# Patient Record
Sex: Male | Born: 1960 | Race: White | Hispanic: No | Marital: Single | Smoking: Never smoker
Health system: Southern US, Community
[De-identification: ages and names within clinical notes are randomized; demographics above are authoritative.]

## PROBLEM LIST (undated history)

## (undated) DIAGNOSIS — N183 Chronic kidney disease, stage 3 unspecified: Secondary | ICD-10-CM

## (undated) DIAGNOSIS — I10 Essential (primary) hypertension: Secondary | ICD-10-CM

## (undated) DIAGNOSIS — I5022 Chronic systolic (congestive) heart failure: Secondary | ICD-10-CM

## (undated) HISTORY — PX: ABDOMINAL SURGERY: SHX537

---

## 2019-10-16 ENCOUNTER — Emergency Department: Payer: Self-pay

## 2019-10-16 ENCOUNTER — Inpatient Hospital Stay
Admission: EM | Admit: 2019-10-16 | Discharge: 2019-10-18 | DRG: 291 | Discharge status: Against Medical Advice | Payer: Self-pay | Attending: Internal Medicine | Admitting: Internal Medicine

## 2019-10-16 ENCOUNTER — Other Ambulatory Visit: Payer: Self-pay

## 2019-10-16 DIAGNOSIS — R778 Other specified abnormalities of plasma proteins: Secondary | ICD-10-CM

## 2019-10-16 DIAGNOSIS — I509 Heart failure, unspecified: Secondary | ICD-10-CM

## 2019-10-16 DIAGNOSIS — I160 Hypertensive urgency: Secondary | ICD-10-CM

## 2019-10-16 DIAGNOSIS — E669 Obesity, unspecified: Secondary | ICD-10-CM | POA: Diagnosis present

## 2019-10-16 DIAGNOSIS — I110 Hypertensive heart disease with heart failure: Principal | ICD-10-CM | POA: Diagnosis present

## 2019-10-16 DIAGNOSIS — L03116 Cellulitis of left lower limb: Secondary | ICD-10-CM | POA: Diagnosis present

## 2019-10-16 DIAGNOSIS — I959 Hypotension, unspecified: Secondary | ICD-10-CM | POA: Diagnosis present

## 2019-10-16 DIAGNOSIS — Z9114 Patient's other noncompliance with medication regimen: Secondary | ICD-10-CM

## 2019-10-16 DIAGNOSIS — R7989 Other specified abnormal findings of blood chemistry: Secondary | ICD-10-CM

## 2019-10-16 DIAGNOSIS — R0781 Pleurodynia: Secondary | ICD-10-CM | POA: Diagnosis present

## 2019-10-16 DIAGNOSIS — W010XXA Fall on same level from slipping, tripping and stumbling without subsequent striking against object, initial encounter: Secondary | ICD-10-CM | POA: Diagnosis present

## 2019-10-16 DIAGNOSIS — Z5329 Procedure and treatment not carried out because of patient's decision for other reasons: Secondary | ICD-10-CM | POA: Diagnosis not present

## 2019-10-16 DIAGNOSIS — F329 Major depressive disorder, single episode, unspecified: Secondary | ICD-10-CM | POA: Diagnosis present

## 2019-10-16 DIAGNOSIS — I161 Hypertensive emergency: Secondary | ICD-10-CM

## 2019-10-16 DIAGNOSIS — N179 Acute kidney failure, unspecified: Secondary | ICD-10-CM | POA: Diagnosis present

## 2019-10-16 DIAGNOSIS — I248 Other forms of acute ischemic heart disease: Secondary | ICD-10-CM | POA: Diagnosis present

## 2019-10-16 DIAGNOSIS — L0390 Cellulitis, unspecified: Secondary | ICD-10-CM

## 2019-10-16 DIAGNOSIS — I5021 Acute systolic (congestive) heart failure: Secondary | ICD-10-CM | POA: Diagnosis present

## 2019-10-16 DIAGNOSIS — L03119 Cellulitis of unspecified part of limb: Secondary | ICD-10-CM

## 2019-10-16 DIAGNOSIS — L97929 Non-pressure chronic ulcer of unspecified part of left lower leg with unspecified severity: Secondary | ICD-10-CM | POA: Diagnosis present

## 2019-10-16 DIAGNOSIS — E8770 Fluid overload, unspecified: Secondary | ICD-10-CM | POA: Diagnosis present

## 2019-10-16 DIAGNOSIS — Z6829 Body mass index (BMI) 29.0-29.9, adult: Secondary | ICD-10-CM

## 2019-10-16 DIAGNOSIS — Z20828 Contact with and (suspected) exposure to other viral communicable diseases: Secondary | ICD-10-CM | POA: Diagnosis present

## 2019-10-16 DIAGNOSIS — R0902 Hypoxemia: Secondary | ICD-10-CM | POA: Diagnosis present

## 2019-10-16 HISTORY — DX: Essential (primary) hypertension: I10

## 2019-10-16 LAB — CBC
HCT: 48.7 % (ref 39.0–52.0)
Hemoglobin: 15.5 g/dL (ref 13.0–17.0)
MCH: 28.5 pg (ref 26.0–34.0)
MCHC: 31.8 g/dL (ref 30.0–36.0)
MCV: 89.7 fL (ref 80.0–100.0)
Platelets: 201 10*3/uL (ref 150–400)
RBC: 5.43 MIL/uL (ref 4.22–5.81)
RDW: 14.8 % (ref 11.5–15.5)
WBC: 10.6 10*3/uL — ABNORMAL HIGH (ref 4.0–10.5)
nRBC: 0 % (ref 0.0–0.2)

## 2019-10-16 LAB — COMPREHENSIVE METABOLIC PANEL
ALT: 42 U/L (ref 0–44)
AST: 46 U/L — ABNORMAL HIGH (ref 15–41)
Albumin: 3.9 g/dL (ref 3.5–5.0)
Alkaline Phosphatase: 60 U/L (ref 38–126)
Anion gap: 11 (ref 5–15)
BUN: 36 mg/dL — ABNORMAL HIGH (ref 6–20)
CO2: 22 mmol/L (ref 22–32)
Calcium: 9 mg/dL (ref 8.9–10.3)
Chloride: 108 mmol/L (ref 98–111)
Creatinine, Ser: 1.73 mg/dL — ABNORMAL HIGH (ref 0.61–1.24)
GFR calc Af Amer: 49 mL/min — ABNORMAL LOW (ref >60)
GFR calc non Af Amer: 42 mL/min — ABNORMAL LOW (ref >60)
Glucose, Bld: 132 mg/dL — ABNORMAL HIGH (ref 70–99)
Potassium: 4.2 mmol/L (ref 3.5–5.1)
Sodium: 141 mmol/L (ref 135–145)
Total Bilirubin: 1.2 mg/dL (ref 0.3–1.2)
Total Protein: 7 g/dL (ref 6.5–8.1)

## 2019-10-16 LAB — BRAIN NATRIURETIC PEPTIDE: B Natriuretic Peptide: 2005 pg/mL — ABNORMAL HIGH (ref 0.0–100.0)

## 2019-10-16 LAB — TROPONIN I (HIGH SENSITIVITY)
Troponin I (High Sensitivity): 143 ng/L (ref <18)
Troponin I (High Sensitivity): 143 ng/L (ref <18)

## 2019-10-16 MED ORDER — FUROSEMIDE 10 MG/ML IJ SOLN
40.0000 mg | Freq: Once | INTRAMUSCULAR | Status: AC
  Administered 2019-10-16: 40 mg via INTRAVENOUS
  Filled 2019-10-16: qty 4

## 2019-10-16 MED ORDER — LABETALOL HCL 5 MG/ML IV SOLN
10.0000 mg | Freq: Once | INTRAVENOUS | Status: AC
  Administered 2019-10-16: 10 mg via INTRAVENOUS
  Filled 2019-10-16: qty 4

## 2019-10-16 MED ORDER — KETOROLAC TROMETHAMINE 30 MG/ML IJ SOLN
30.0000 mg | Freq: Once | INTRAMUSCULAR | Status: AC
  Administered 2019-10-16: 30 mg via INTRAVENOUS
  Filled 2019-10-16: qty 1

## 2019-10-16 NOTE — ED Triage Notes (Addendum)
Pt here for bilat rib pain states tripped on shoe and fell. States pain worse when he moves. PT also here for htn, states has not had meds in a few weeks.

## 2019-10-16 NOTE — ED Provider Notes (Signed)
[facility] Emergency Department Provider Note   First MD Initiated Contact with Patient 10/16/19 2206     (approximate)  I have reviewed the triage vital signs and the nursing notes.   HISTORY  Chief Complaint Fall    HPI Tristan Villegas is a 58 y.o. male   with history of hypertension noncompliant with medications x2 weeks presents to the emergency department secondary to right side rib pain after tripping over his shoe striking the right side of his chest.  Patient noted to be markedly hypertensive on arrival blood pressure 201/142 patient tachypneic at present respiratory rate of 24.  Patient denies any lower extremity pain or swelling.  No history of DVT or PE.  Patient denies any history of congestive heart failure.       No past medical history on file.  There are no active problems to display for this patient.     Prior to Admission medications   Not on File    Allergies Patient has no allergy information on record.  No family history on file.  Social History Social History   Tobacco Use  . Smoking status: Not on file  Substance Use Topics  . Alcohol use: Not on file  . Drug use: Not on file    Review of Systems Constitutional: No fever/chills Eyes: No visual changes. ENT: No sore throat. Cardiovascular: Positive for chest pain Respiratory: Positive for shortness of breath. Gastrointestinal: No abdominal pain.  No nausea, no vomiting.  No diarrhea.  No constipation. Genitourinary: Negative for dysuria. Musculoskeletal: Negative for neck pain.  Negative for back pain. Integumentary: Negative for rash. Neurological: Negative for headaches, focal weakness or numbness.   ____________________________________________   PHYSICAL EXAM:  VITAL SIGNS: ED Triage Vitals  Enc Vitals Group     BP 10/16/19 2032 (!) 201/142     Pulse Rate 10/16/19 2032 94     Resp 10/16/19 2032 20     Temp 10/16/19 2032 98.2 F (36.8 C)   Temp Source 10/16/19 2032 Oral     SpO2 10/16/19 2032 98 %     Weight 10/16/19 2042 106.6 kg (235 lb)     Height 10/16/19 2042 1.829 m (6')     Head Circumference --      Peak Flow --      Pain Score 10/16/19 2041 0     Pain Loc --      Pain Edu? --      Excl. in [place]? --     Constitutional: Alert and oriented.  Eyes: Conjunctivae are normal.  Mouth/Throat: Patient is wearing a mask. Neck: No stridor.  No meningeal signs.   Cardiovascular: Normal rate, regular rhythm. Good peripheral circulation. Grossly normal heart sounds. Respiratory: Tachypnea, bibasilar rhonchi Gastrointestinal: Soft and nontender. No distention.  Musculoskeletal: No lower extremity tenderness nor edema. No gross deformities of extremities. Neurologic:  Normal speech and language. No gross focal neurologic deficits are appreciated.  Skin:  Skin is warm, dry and intact. Psychiatric: Mood and affect are normal. Speech and behavior are normal.  ____________________________________________   LABS (all labs ordered are listed, but only abnormal results are displayed)  Labs Reviewed  CBC - Abnormal; Notable for the following components:      Result Value   WBC 10.6 (*)    All other components within normal limits  COMPREHENSIVE METABOLIC PANEL - Abnormal; Notable for the following components:   Glucose, Bld 132 (*)    BUN 36 (*)  Creatinine, Ser 1.73 (*)    AST 46 (*)    GFR calc non Af Amer 42 (*)    GFR calc Af Amer 49 (*)    All other components within normal limits  TROPONIN I (HIGH SENSITIVITY) - Abnormal; Notable for the following components:   Troponin I (High Sensitivity) 143 (*)    All other components within normal limits  BRAIN NATRIURETIC PEPTIDE  TROPONIN I (HIGH SENSITIVITY)   ____________________________________________  EKG  ED ECG REPORT I, [place] N Tasheem Elms, the attending physician, personally viewed and interpreted this ECG.   Date: 10/16/2019  EKG Time: 10:27 PM  Rate: 87   Rhythm: Normal sinus rhythm  Axis: Normal  Intervals: Normal  ST&T Change: None  ____________________________________________  RADIOLOGY I, [place] N Mayu Ronk, personally viewed and evaluated these images (plain radiographs) as part of my medical decision making, as well as reviewing the written report by the radiologist.  ED MD interpretation: Cardiomegaly with vascular congestion  Official radiology report(s): Dg Chest 2 View  Result Date: 10/16/2019 CLINICAL DATA:  Fall, bilateral rib pain EXAM: CHEST - 2 VIEW COMPARISON:  None. FINDINGS: Cardiomegaly, vascular congestion. No confluent opacities or effusions. No acute bony abnormality. No visible rib fracture or pneumothorax. IMPRESSION: Cardiomegaly, vascular congestion. Electronically Signed   By: Charlett Nose M.D.   On: 10/16/2019 21:18    ____________________________________________   PROCEDURES     .Critical Care Performed by: Darci Current, MD Authorized by: Darci Current, MD   Critical care provider statement:    Critical care time (minutes):  45   Critical care time was exclusive of:  Separately billable procedures and treating other patients   Critical care was necessary to treat or prevent imminent or life-threatening deterioration of the following conditions:  Respiratory failure   Critical care was time spent personally by me on the following activities:  Development of treatment plan with patient or surrogate, discussions with consultants, evaluation of patient's response to treatment, examination of patient, obtaining history from patient or surrogate, ordering and performing treatments and interventions, ordering and review of laboratory studies, ordering and review of radiographic studies, pulse oximetry, re-evaluation of patient's condition and review of old charts     ____________________________________________   INITIAL IMPRESSION / MDM / ASSESSMENT AND PLAN / ED COURSE  As part of my medical  decision making, I reviewed the following data within the electronic MEDICAL RECORD NUMBER  58 year old male presented with above-stated history and physical exam secondary to chest pain with markedly elevated blood pressure on arrival.  Troponin elevated at 143.  BNP 2005.  Patient given 10 mg IV labetalol and 40 mg of IV Lasix  ____________________________________________  FINAL CLINICAL IMPRESSION(S) / ED DIAGNOSES  Final diagnoses:  Hypertensive emergency  Elevated troponin     MEDICATIONS GIVEN DURING THIS VISIT:  Medications - No data to display   ED Discharge Orders    None      *Please note:  Tristan Villegas was evaluated in Emergency Department on 10/16/2019 for the symptoms described in the history of present illness. He was evaluated in the context of the global COVID-19 pandemic, which necessitated consideration that the patient might be at risk for infection with the SARS-CoV-2 virus that causes COVID-19. Institutional protocols and algorithms that pertain to the evaluation of patients at risk for COVID-19 are in a state of rapid change based on information released by regulatory bodies including the CDC and federal and state organizations. These policies and algorithms were  followed during the patient's care in the ED.  Some ED evaluations and interventions may be delayed as a result of limited staffing during the pandemic.*  Note:  This document was prepared using Dragon voice recognition software and may include unintentional dictation errors.   Darci Current, MD 10/16/19 [phone]

## 2019-10-17 ENCOUNTER — Inpatient Hospital Stay (HOSPITAL_COMMUNITY)
Admit: 2019-10-17 | Discharge: 2019-10-17 | Disposition: A | Discharge status: Home / Self Care | Payer: Self-pay | Attending: Family Medicine | Admitting: Family Medicine

## 2019-10-17 ENCOUNTER — Inpatient Hospital Stay: Payer: Self-pay

## 2019-10-17 ENCOUNTER — Other Ambulatory Visit: Payer: Self-pay

## 2019-10-17 DIAGNOSIS — I509 Heart failure, unspecified: Secondary | ICD-10-CM

## 2019-10-17 DIAGNOSIS — N179 Acute kidney failure, unspecified: Secondary | ICD-10-CM

## 2019-10-17 DIAGNOSIS — S81802S Unspecified open wound, left lower leg, sequela: Secondary | ICD-10-CM

## 2019-10-17 DIAGNOSIS — N189 Chronic kidney disease, unspecified: Secondary | ICD-10-CM

## 2019-10-17 DIAGNOSIS — I160 Hypertensive urgency: Secondary | ICD-10-CM

## 2019-10-17 DIAGNOSIS — L03119 Cellulitis of unspecified part of limb: Secondary | ICD-10-CM

## 2019-10-17 DIAGNOSIS — R0602 Shortness of breath: Secondary | ICD-10-CM

## 2019-10-17 DIAGNOSIS — R778 Other specified abnormalities of plasma proteins: Secondary | ICD-10-CM

## 2019-10-17 DIAGNOSIS — L03116 Cellulitis of left lower limb: Secondary | ICD-10-CM

## 2019-10-17 LAB — BASIC METABOLIC PANEL
Anion gap: 9 (ref 5–15)
BUN: 33 mg/dL — ABNORMAL HIGH (ref 6–20)
CO2: 24 mmol/L (ref 22–32)
Calcium: 8.7 mg/dL — ABNORMAL LOW (ref 8.9–10.3)
Chloride: 109 mmol/L (ref 98–111)
Creatinine, Ser: 1.76 mg/dL — ABNORMAL HIGH (ref 0.61–1.24)
GFR calc Af Amer: 48 mL/min — ABNORMAL LOW (ref >60)
GFR calc non Af Amer: 41 mL/min — ABNORMAL LOW (ref >60)
Glucose, Bld: 103 mg/dL — ABNORMAL HIGH (ref 70–99)
Potassium: 3.9 mmol/L (ref 3.5–5.1)
Sodium: 142 mmol/L (ref 135–145)

## 2019-10-17 LAB — CBC
HCT: 45.7 % (ref 39.0–52.0)
Hemoglobin: 14.2 g/dL (ref 13.0–17.0)
MCH: 28.1 pg (ref 26.0–34.0)
MCHC: 31.1 g/dL (ref 30.0–36.0)
MCV: 90.5 fL (ref 80.0–100.0)
Platelets: 180 10*3/uL (ref 150–400)
RBC: 5.05 MIL/uL (ref 4.22–5.81)
RDW: 14.9 % (ref 11.5–15.5)
WBC: 9.9 10*3/uL (ref 4.0–10.5)
nRBC: 0 % (ref 0.0–0.2)

## 2019-10-17 LAB — SARS CORONAVIRUS 2 (TAT 6-24 HRS): SARS Coronavirus 2: NEGATIVE

## 2019-10-17 LAB — ECHOCARDIOGRAM COMPLETE
Height: 72 in
Weight: 3760 oz

## 2019-10-17 LAB — HIV ANTIBODY (ROUTINE TESTING W REFLEX): HIV Screen 4th Generation wRfx: NONREACTIVE

## 2019-10-17 MED ORDER — VANCOMYCIN HCL 10 G IV SOLR
1250.0000 mg | INTRAVENOUS | Status: DC
  Filled 2019-10-17: qty 1250

## 2019-10-17 MED ORDER — COLLAGENASE 250 UNIT/GM EX OINT
TOPICAL_OINTMENT | Freq: Every day | CUTANEOUS | Status: DC
  Administered 2019-10-17 – 2019-10-18 (×2): via TOPICAL
  Filled 2019-10-17: qty 30

## 2019-10-17 MED ORDER — VANCOMYCIN HCL IN DEXTROSE 1-5 GM/200ML-% IV SOLN: 1000.0000 mg | Freq: Once | INTRAVENOUS | Status: DC

## 2019-10-17 MED ORDER — HYDRALAZINE HCL 20 MG/ML IJ SOLN
10.0000 mg | INTRAMUSCULAR | Status: DC | PRN
  Administered 2019-10-18: 10 mg via INTRAVENOUS
  Filled 2019-10-17: qty 1

## 2019-10-17 MED ORDER — VANCOMYCIN HCL 10 G IV SOLR
2000.0000 mg | Freq: Once | INTRAVENOUS | Status: AC
  Administered 2019-10-17: 2000 mg via INTRAVENOUS
  Filled 2019-10-17: qty 2000

## 2019-10-17 MED ORDER — LABETALOL HCL 5 MG/ML IV SOLN
10.0000 mg | Freq: Once | INTRAVENOUS | Status: AC
  Administered 2019-10-17: 10 mg via INTRAVENOUS
  Filled 2019-10-17: qty 4

## 2019-10-17 MED ORDER — FUROSEMIDE 10 MG/ML IJ SOLN
40.0000 mg | Freq: Every day | INTRAMUSCULAR | Status: DC
  Administered 2019-10-17 – 2019-10-18 (×2): 40 mg via INTRAVENOUS
  Filled 2019-10-17 (×2): qty 4

## 2019-10-17 MED ORDER — SODIUM CHLORIDE 0.9 % IV SOLN
1.0000 g | INTRAVENOUS | Status: DC
  Administered 2019-10-18: 05:00:00 1 g via INTRAVENOUS
  Filled 2019-10-17: qty 10
  Filled 2019-10-17: qty 1
  Filled 2019-10-17: qty 10

## 2019-10-17 MED ORDER — ENOXAPARIN SODIUM 40 MG/0.4ML ~~LOC~~ SOLN
40.0000 mg | SUBCUTANEOUS | Status: DC
  Administered 2019-10-17 – 2019-10-18 (×2): 40 mg via SUBCUTANEOUS
  Filled 2019-10-17 (×2): qty 0.4

## 2019-10-17 MED ORDER — HYDROCODONE-ACETAMINOPHEN 5-325 MG PO TABS
1.0000 | ORAL_TABLET | Freq: Four times a day (QID) | ORAL | Status: DC | PRN
  Administered 2019-10-17 – 2019-10-18 (×3): 2 via ORAL
  Filled 2019-10-17 (×3): qty 2

## 2019-10-17 MED ORDER — VANCOMYCIN HCL 1.25 G IV SOLR
1250.0000 mg | INTRAVENOUS | Status: DC
  Administered 2019-10-18: 1250 mg via INTRAVENOUS
  Filled 2019-10-17 (×2): qty 1250

## 2019-10-17 MED ORDER — AMLODIPINE BESYLATE 10 MG PO TABS
10.0000 mg | ORAL_TABLET | Freq: Every day | ORAL | Status: DC
  Administered 2019-10-17 – 2019-10-18 (×2): 10 mg via ORAL
  Filled 2019-10-17 (×2): qty 1

## 2019-10-17 NOTE — ED Notes (Addendum)
ED TO INPATIENT HANDOFF REPORT  ED Nurse Name and Phone #: Karena Addison [phone]  S Name/Age/Gender Tristan Villegas 58 y.o. male Room/Bed: ED36A/ED36A  Code Status   Code Status: Full Code  Home/SNF/Other Home Patient oriented to: self, place, time and situation Is this baseline? Yes   Triage Complete: Triage complete  Chief Complaint fall ems  Triage Note Pt here for bilat rib pain states tripped on shoe and fell. States pain worse when he moves. PT also here for htn, states has not had meds in a few weeks.    Allergies Not on File  Level of Care/Admitting Diagnosis ED Disposition    ED Disposition Condition [facility] Area: [place] [[id number]]  Level of Care: Telemetry [5]  Covid Evaluation: Asymptomatic Screening Protocol (No Symptoms)  Diagnosis: Acute CHF (congestive heart failure) [facility]) [[id number]]  Admitting Physician: Orene Desanctis [[id number]]  Attending Physician: Orene Desanctis [[id number]]  Estimated length of stay: past midnight tomorrow  Certification:: I certify this patient will need inpatient services for at least 2 midnights  PT Class (Do Not Modify): Inpatient [101]  PT Acc Code (Do Not Modify): Private [1]       B Medical/Surgery History No past medical history on file.   A IV Location/Drains/Wounds Patient Lines/Drains/Airways Status   Active Line/Drains/Airways    Name:   Placement date:   Placement time:   Site:   Days:   Peripheral IV 10/17/19 Right Forearm   10/17/19    0220    Forearm   less than 1   Peripheral IV 10/17/19 Left Forearm   10/17/19    0233    Forearm   less than 1          Intake/Output Last 24 hours  Intake/Output Summary (Last 24 hours) at 10/17/2019 0419 Last data filed at 10/17/2019 0307 Gross per 24 hour  Intake -  Output 800 ml  Net -800 ml    Labs/Imaging Results for orders placed or performed during the hospital encounter of 10/16/19 (from the past 48 hour(s))  CBC     Status: Abnormal    Collection Time: 10/16/19  8:56 PM  Result Value Ref Range   WBC 10.6 (H) 4.0 - 10.5 K/uL   RBC 5.43 4.22 - 5.81 MIL/uL   Hemoglobin 15.5 13.0 - 17.0 g/dL   HCT 48.7 39.0 - 52.0 %   MCV 89.7 80.0 - 100.0 fL   MCH 28.5 26.0 - 34.0 pg   MCHC 31.8 30.0 - 36.0 g/dL   RDW 14.8 11.5 - 15.5 %   Platelets 201 150 - 400 K/uL   nRBC 0.0 0.0 - 0.2 %    Comment: Performed at [facility], [address] [place],  [address]  Comprehensive metabolic panel     Status: Abnormal   Collection Time: 10/16/19  8:56 PM  Result Value Ref Range   Sodium 141 135 - 145 mmol/L   Potassium 4.2 3.5 - 5.1 mmol/L   Chloride 108 98 - 111 mmol/L   CO2 22 22 - 32 mmol/L   Glucose, Bld 132 (H) 70 - 99 mg/dL   BUN 36 (H) 6 - 20 mg/dL   Creatinine, Ser 1.73 (H) 0.61 - 1.24 mg/dL   Calcium 9.0 8.9 - 10.3 mg/dL   Total Protein 7.0 6.5 - 8.1 g/dL   Albumin 3.9 3.5 - 5.0 g/dL   AST 46 (H) 15 - 41 U/L   ALT 42 0 -  44 U/L   Alkaline Phosphatase 60 38 - 126 U/L   Total Bilirubin 1.2 0.3 - 1.2 mg/dL   GFR calc non Af Amer 42 (L) >60 mL/min   GFR calc Af Amer 49 (L) >60 mL/min   Anion gap 11 5 - 15    Comment: Performed at [facility], [address] [place], [place] [address]  Troponin I (High Sensitivity)     Status: Abnormal   Collection Time: 10/16/19  8:56 PM  Result Value Ref Range   Troponin I (High Sensitivity) 143 (HH) <18 ng/L    Comment: CRITICAL RESULT CALLED TO, READ BACK BY AND VERIFIED WITH LISA THOMPSON 10/16/19 @ 2141 MLK (NOTE) Elevated high sensitivity troponin I (hsTnI) values and significant  changes across serial measurements may suggest ACS but many other  chronic and acute conditions are known to elevate hsTnI results.  Refer to the "Links" section for chest pain algorithms and additional  guidance. Performed at [facility], [address] [place], [place] [address]   Brain natriuretic peptide     Status: Abnormal   Collection Time: 10/16/19  10:07 PM  Result Value Ref Range   B Natriuretic Peptide 2,005.0 (H) 0.0 - 100.0 pg/mL    Comment: Performed at [facility], [address] [place], [place] [address]  Troponin I (High Sensitivity)     Status: Abnormal   Collection Time: 10/16/19 10:07 PM  Result Value Ref Range   Troponin I (High Sensitivity) 143 (HH) <18 ng/L    Comment: CRITICAL VALUE NOTED. VALUE IS CONSISTENT WITH PREVIOUSLY REPORTED/CALLED VALUE/TFK (NOTE) Elevated high sensitivity troponin I (hsTnI) values and significant  changes across serial measurements may suggest ACS but many other  chronic and acute conditions are known to elevate hsTnI results.  Refer to the "Links" section for chest pain algorithms and additional  guidance. Performed at [facility], [address] [place], [place] [address]    Dg Chest 2 View  Result Date: 10/16/2019 CLINICAL DATA:  Fall, bilateral rib pain EXAM: CHEST - 2 VIEW COMPARISON:  None. FINDINGS: Cardiomegaly, vascular congestion. No confluent opacities or effusions. No acute bony abnormality. No visible rib fracture or pneumothorax. IMPRESSION: Cardiomegaly, vascular congestion. Electronically Signed   By: Charlett Nose M.D.   On: 10/16/2019 21:18   Dg Foot 2 Views Left  Result Date: 10/17/2019 CLINICAL DATA:  Cellulitis EXAM: LEFT FOOT - 2 VIEW COMPARISON:  None. FINDINGS: No acute bony abnormality. Specifically, no fracture, subluxation, or dislocation. No radiographic changes of osteomyelitis. Calcaneal spur. IMPRESSION: No acute bony abnormality. Electronically Signed   By: Charlett Nose M.D.   On: 10/17/2019 01:26    Pending Labs Unresulted Labs (From admission, onward)    Start     Ordered   10/17/19 0500  Basic metabolic panel  Tomorrow morning,   STAT     10/17/19 0038   10/17/19 0500  CBC  Tomorrow morning,   STAT     10/17/19 0038   10/17/19 0327  SARS CORONAVIRUS 2 (TAT 6-24 HRS) Nasopharyngeal Nasopharyngeal Swab  (Asymptomatic/Tier 2  Patients Labs)  Once,   STAT    Question Answer Comment  Is this test for diagnosis or screening Screening   Symptomatic for COVID-19 as defined by CDC No   Hospitalized for COVID-19 No   Admitted to ICU for COVID-19 No   Previously tested for COVID-19 No   Resident in a congregate (group) care setting No   Employed in healthcare  setting No      10/17/19 0326   10/17/19 0040  Culture, blood (routine x 2)  BLOOD CULTURE X 2,   STAT    Comments: for severe disease only    10/17/19 0039   10/17/19 0035  HIV Antibody (routine testing w rflx)  (HIV Antibody (Routine testing w reflex) panel)  Once,   STAT     10/17/19 0038          Vitals/Pain Today's Vitals   10/17/19 0001 10/17/19 0207 10/17/19 0330 10/17/19 0400  BP: (!) 178/129 (!) 197/132 (!) 183/122 (!) 179/117  Pulse: 71 75 66 70  Resp: 20 20 20 20   Temp:      TempSrc:      SpO2: 96% 98% 96% 96%  Weight:      Height:      PainSc:        Isolation Precautions No active isolations  Medications Medications  enoxaparin (LOVENOX) injection 40 mg (40 mg Subcutaneous Given 10/17/19 0301)  vancomycin (VANCOCIN) 2,000 mg in sodium chloride 0.9 % 500 mL IVPB (2,000 mg Intravenous New Bag/Given 10/17/19 0223)  cefTRIAXone (ROCEPHIN) 1 g in sodium chloride 0.9 % 100 mL IVPB (has no administration in time range)  amLODipine (NORVASC) tablet 10 mg (has no administration in time range)  hydrALAZINE (APRESOLINE) injection 10 mg (has no administration in time range)  furosemide (LASIX) injection 40 mg (has no administration in time range)  vancomycin (VANCOCIN) 1,250 mg in sodium chloride 0.9 % 250 mL IVPB (has no administration in time range)  ketorolac (TORADOL) 30 MG/ML injection 30 mg (30 mg Intravenous Given 10/16/19 2250)  labetalol (NORMODYNE) injection 10 mg (10 mg Intravenous Given 10/16/19 2250)  furosemide (LASIX) injection 40 mg (40 mg Intravenous Given 10/16/19 2347)  labetalol (NORMODYNE) injection 10 mg (10 mg Intravenous  Given 10/17/19 0254)    Mobility walks with person assist Low fall risk   Focused Assessments    R Recommendations: See Admitting Provider Note  Report given to: 13/6/20, RN

## 2019-10-17 NOTE — Progress Notes (Signed)
Pharmacy Antibiotic Note  Tristan Villegas is a 58 y.o. male admitted on 10/16/2019 with cellulitis.  Pharmacy has been consulted for vancomycin dosing.  Plan: Patient received vanc 2g IV load in ED  Vancomycin 1250 mg IV Q 24 hrs. Goal AUC 400-550. Expected AUC: 512.3 SCr used: 1.73 Cssmin: 12.6  Will continue to monitor renal function as patient appears to be in AKI unsure if CKD since no PMH is on file, will check BMP w/ am labs to reassess.  Height: 6' (182.9 cm) Weight: 235 lb (106.6 kg) IBW/kg (Calculated) : 77.6  Temp (24hrs), Avg:98.2 F (36.8 C), Min:98.2 F (36.8 C), Max:98.2 F (36.8 C)  Recent Labs  Lab 10/16/19 2056  WBC 10.6*  CREATININE 1.73*    Estimated Creatinine Clearance: 57.3 mL/min (A) (by C-G formula based on SCr of 1.73 mg/dL (H)).    Not on File   Thank you for allowing pharmacy to be a part of this patient's care.  Tobie Lords, PharmD, BCPS Clinical Pharmacist 10/17/2019 4:18 AM

## 2019-10-17 NOTE — H&P (Signed)
History and Physical    Tristan Villegas OMB:[id number] DOB: 10-28-1961 DOA: 10/16/2019  PCP: No primary care provider on file.  Patient coming from: Home  I have personally briefly reviewed patient's old medical records in [facility]  Chief Complaint: Fall and anterior rib pain  HPI: Tristan Villegas is a 58 y.o. male with medical history significant of hypertension who presents with concerns of a fall after tripping over her shoes and having right-sided rib pain.  He was found to be hypertensive on arrival with blood pressure of 201/142 and reportedly was tachypneic with respiration of 24.  Patient reports missing his antihypertensive for the past several weeks due to loss of his job and insurance. Denies any headache, vision changes. Notes right sided rib pain below right breast. Denies shortness of breath, orthopnea or PND.   ED Course: He was afebrile and hypertensive up to 201/142.  CBC showed leukocytosis of 10.6 but no anemia.  CMP showed elevated creatinine of 1.73 without a prior for comparison.  Troponin flat at 143.  BNP of over 3000.  EKG shows normal sinus rhythm. Chest x-ray shows cardiomegaly and vascular congestion.  No visible rib fractures. He was given IV 40mg  Lasix and 10mg  Labetalol.   Review of Systems:  Constitutional: No Weight Change, No Fever ENT/Mouth: No sore throat, No Rhinorrhea Eyes: No Eye Pain, No Vision Changes Cardiovascular: No Chest Pain, no SOB, No PND, No Dyspnea on Exertion, No Orthopnea, No Claudication, No Edema, No Palpitations Respiratory: No Cough, No Sputum, No Wheezing, no Dyspnea  Gastrointestinal: No Nausea, No Vomiting, No Diarrhea, No Constipation, No Pain Genitourinary: no Urinary Incontinence, No Urgency, No Flank Pain Musculoskeletal: No Arthralgias, No Myalgias Skin: No Skin Lesions, No Pruritus, Neuro: no Weakness, No Numbness,  No Loss of Consciousness, No Syncope Psych: No Anxiety/Panic, No Depression, no decrease appetite  Heme/Lymph: No Bruising, No Bleeding   Patient denies any tobacco, alcohol or illicit drug use.  Family history reviewed and not pertinent   Prior to Admission medications   Not on File    Physical Exam: Vitals:   10/16/19 2032 10/16/19 2042 10/17/19 0001 10/17/19 0207  BP: (!) 201/142  (!) 178/129 (!) 197/132  Pulse: 94  71 75  Resp: 20  20 20   Temp: 98.2 F (36.8 C)     TempSrc: Oral     SpO2: 98%  96% 98%  Weight:  106.6 kg    Height:  6' (1.829 m)      Constitutional: NAD, calm, comfortable, obese male with dyspnea after ambulating with restroom Vitals:   10/16/19 2032 10/16/19 2042 10/17/19 0001 10/17/19 0207  BP: (!) 201/142  (!) 178/129 (!) 197/132  Pulse: 94  71 75  Resp: 20  20 20   Temp: 98.2 F (36.8 C)     TempSrc: Oral     SpO2: 98%  96% 98%  Weight:  106.6 kg    Height:  6' (1.829 m)     Eyes: PERRL, lids and conjunctivae normal ENMT: Mucous membranes are moist. Posterior pharynx clear of any exudate or lesions. Neck: normal, supple, no masses, no thyromegaly Respiratory: clear to auscultation bilaterally, no wheezing, no crackles. Dyspnea with exertion. No accessory muscle use.  Cardiovascular: Regular rate and rhythm, no murmurs / rubs / gallops. +3 pitting edema bilaterally. Abdomen: no tenderness, no masses palpated.  Bowel sounds positive.  Musculoskeletal: no clubbing / cyanosis. No joint deformity upper and lower extremities. 2cm x 2cm wound with purulent drainage and eschar  with surrounding spreading erythema on the medial side proximal to left ankle that is tender to palpation and warm to touch.  Skin: no rashes, lesions, ulcers. No induration Neurologic: CN 2-12 grossly intact. Sensation intact. Strength 5/5 in all 4.  Psychiatric: Normal judgment and insight. Alert and oriented x 3. Normal mood.     Labs on Admission: I have personally reviewed following labs and imaging studies  CBC: Recent Labs  Lab 10/16/19 2056  WBC 10.6*  HGB 15.5   HCT 48.7  MCV 89.7  PLT 734   Basic Metabolic Panel: Recent Labs  Lab 10/16/19 2056  NA 141  K 4.2  CL 108  CO2 22  GLUCOSE 132*  BUN 36*  CREATININE 1.73*  CALCIUM 9.0   GFR: Estimated Creatinine Clearance: 57.3 mL/min (A) (by C-G formula based on SCr of 1.73 mg/dL (H)). Liver Function Tests: Recent Labs  Lab 10/16/19 2056  AST 46*  ALT 42  ALKPHOS 60  BILITOT 1.2  PROT 7.0  ALBUMIN 3.9   No results for input(s): LIPASE, AMYLASE in the last 168 hours. No results for input(s): AMMONIA in the last 168 hours. Coagulation Profile: No results for input(s): INR, PROTIME in the last 168 hours. Cardiac Enzymes: No results for input(s): CKTOTAL, CKMB, CKMBINDEX, TROPONINI in the last 168 hours. BNP (last 3 results) No results for input(s): PROBNP in the last 8760 hours. HbA1C: No results for input(s): HGBA1C in the last 72 hours. CBG: No results for input(s): GLUCAP in the last 168 hours. Lipid Profile: No results for input(s): CHOL, HDL, LDLCALC, TRIG, CHOLHDL, LDLDIRECT in the last 72 hours. Thyroid Function Tests: No results for input(s): TSH, T4TOTAL, FREET4, T3FREE, THYROIDAB in the last 72 hours. Anemia Panel: No results for input(s): VITAMINB12, FOLATE, FERRITIN, TIBC, IRON, RETICCTPCT in the last 72 hours. Urine analysis: No results found for: COLORURINE, APPEARANCEUR, LABSPEC, PHURINE, GLUCOSEU, HGBUR, BILIRUBINUR, KETONESUR, PROTEINUR, UROBILINOGEN, NITRITE, LEUKOCYTESUR  Radiological Exams on Admission: Dg Chest 2 View  Result Date: 10/16/2019 CLINICAL DATA:  Fall, bilateral rib pain EXAM: CHEST - 2 VIEW COMPARISON:  None. FINDINGS: Cardiomegaly, vascular congestion. No confluent opacities or effusions. No acute bony abnormality. No visible rib fracture or pneumothorax. IMPRESSION: Cardiomegaly, vascular congestion. Electronically Signed   By: Rolm Baptise M.D.   On: 10/16/2019 21:18   Dg Foot 2 Views Left  Result Date: 10/17/2019 CLINICAL DATA:   Cellulitis EXAM: LEFT FOOT - 2 VIEW COMPARISON:  None. FINDINGS: No acute bony abnormality. Specifically, no fracture, subluxation, or dislocation. No radiographic changes of osteomyelitis. Calcaneal spur. IMPRESSION: No acute bony abnormality. Electronically Signed   By: Rolm Baptise M.D.   On: 10/17/2019 01:26    EKG: Independently reviewed.   Assessment/Plan  Acute congestive heart failure secondary to hypertensive urgency -Patient with blood pressure over 201/142 on admission secondary to missing his antihypertensive due to financial constraints -strict Is and Os  - daily weights  - obtain echo  - continue daily IV 40Lasix    Hypertensive urgency -Resume amlodipine 10 mg - PRN hydralazine every 4hr for systolic greater than 193/790 -Unable to start ACE/ARB or beta blocker at this time due to AKI and new CHF   Elevated troponin -Flat at 148.  Likely secondary to demand ischemia from hypotension.  Left lower extremity cellulitis -Patient found to have cellulitis on exam.  Notes it has been ongoing for several months. -Left ankle x-ray obtained showed no radiographic findings of osteomyelitis. -Consult wound care -Start IV vancomycin and Rocephin  Acute kidney injury -Creatinine of 1.73 on admission.  No prior for comparison. -Likely secondary to poor perfusion from volume overload.  Should expect increase with diuresis.  DVT prophylaxis:Lovenox Code Status:Full Family Communication: Plan discussed with patient at bedside  disposition Plan: Home with at least 2 midnight stays  Consults called:  Admission status: inpatient   Tristan Schooler T Alga Southall DO [facility]   If 7PM-7AM, please contact night-coverage [email or web address] Password [facility]  10/17/2019, 3:32 AM

## 2019-10-17 NOTE — ED Notes (Signed)
MD notified of pt's bp, labetelol ordered and given

## 2019-10-17 NOTE — Progress Notes (Signed)
PROGRESS NOTE  Tristan Villegas GYB:[id number] DOB: [birth year] DOA: 10/16/2019 PCP: Patient, No Pcp Per  Brief History   The patient is a 58 yr old man who presented to the [facility] ED last night with complaints of shortness of breath and right sided rib pain. He was found to be profoundly hypertensive and hypoxic. The patient had not taken his antihypertensives for the past several weeks since losing his job and his insurance. He had a BNP of greater than 3000 and an elevated but flat set of troponins. Creatinine was elevated at 1.73, but no prior creatinines were available for comparison. CXR demonstrated cardiomegaly and vascular congestion. He had bilateral lower extremity edema. The patient was also found to have left lower extremity cellullitis and chronic wounds to the medial aspect of the distal left lower extremity.   He has been admitted to a telemetry bed. He has been started on Amlodipine, hydralazine, and lasix. He is also receiving IV Vancomycin for left lower extremity cellulitis and wounds. Wound care has been consulted as had general surgery. Echocardiogram is pending.  Consultants  . Wound Care . General Surgery . Case management.  Procedures  . None  Antibiotics   Anti-infectives (From admission, onward)   Start     Dose/Rate Route Frequency Ordered Stop   10/18/19 0200  vancomycin (VANCOCIN) 1,250 mg in sodium chloride 0.9 % 250 mL IVPB  Status:  Discontinued     1,250 mg 166.7 mL/hr over 90 Minutes Intravenous Every 24 hours 10/17/19 0416 10/17/19 0815   10/18/19 0200  vancomycin (VANCOCIN) 1,250 mg in sodium chloride 0.9 % 250 mL IVPB     1,250 mg 166.7 mL/hr over 90 Minutes Intravenous Every 24 hours 10/17/19 0815     10/17/19 0330  cefTRIAXone (ROCEPHIN) 1 g in sodium chloride 0.9 % 100 mL IVPB     1 g 200 mL/hr over 30 Minutes Intravenous Every 24 hours 10/17/19 0328     10/17/19 0115  vancomycin (VANCOCIN) 2,000 mg in sodium chloride 0.9 % 500 mL IVPB     2,000 mg  250 mL/hr over 120 Minutes Intravenous  Once 10/17/19 0113 10/17/19 0437   10/17/19 0045  vancomycin (VANCOCIN) IVPB 1000 mg/200 mL premix  Status:  Discontinued     1,000 mg 200 mL/hr over 60 Minutes Intravenous  Once 10/17/19 0039 10/17/19 0113    .  Subjective  The patient is resting quietly. He quickly becomes tearful when we begin to discuss his insurance and employment situation. No new complaints.  Objective   Vitals:  Vitals:   10/17/19 0738 10/17/19 1013  BP: (!) 182/112 (!) 154/97  Pulse: 66   Resp: 17   Temp: 97.6 F (36.4 C)   SpO2: 97%    Exam:  Constitutional:  . The patient is awake, alert, and oriented x 3. He is in moderate distress from his lack of employment and insurance. Respiratory:  . No increased work of breathing. Marland Kitchen Posiitve for diffuse rales and wheezes. No rhonchi . No tactile fremitus Cardiovascular:  . Regular rate and rhythm . No murmurs, ectopy, or gallups. . No lateral PMI. No thrills. Abdomen:  . Abdomen is soft, non-tender, non-distended . No hernias, masses, or organomegaly . Normoactive bowel sounds.  Musculoskeletal:  . No cyanosis or clubbing. +2 pitting edema. Skin:  . No rashes, lesions, ulcers . palpation of skin: no induration or nodules Neurologic:  . CN 2-12 intact . Sensation all 4 extremities intact Psychiatric:  . Mental status o  Mood, affect appropriate o Orientation to person, place, time  . judgment and insight appear intact  I have personally reviewed the following:   Today's Data  . Vitals, BMP, CBC, BNP, Troponin, EKG  Scheduled Meds: . amLODipine  10 mg Oral Daily  . collagenase   Topical Daily  . enoxaparin (LOVENOX) injection  40 mg Subcutaneous Q24H  . furosemide  40 mg Intravenous Daily   Continuous Infusions: . cefTRIAXone (ROCEPHIN)  IV    . [START ON 10/18/2019] vancomycin      Principal Problem:   Acute CHF (congestive heart failure) (HCC) Active Problems:   Hypertensive urgency   AKI  (acute kidney injury) ([place])   Lower extremity cellulitis   LOS: 0 days   A & P  Hypertensive Urgency: Systolic Blood pressures in the 200's on admission with diastolic pressures in the 90-100's. Improved with amlodipine and hydralazine. Pulmonary vascular congestion on CXR with hypoxia. Case management consulted to assist the patient with obtaining his medications.   Acute congestive heart failure: Likely due (at least in part) to hypertensive urgency. Echocardiogram is pending. Pt is responding to diuresis with negative 2.8 L fluid balance. Continue to diurese. Consider cardiology consult pending results of echocardiogram.-Patient with blood pressure over 201/142 on admission secondary to missing his antihypertensive due to financial constraints. Monitor volume status, creatinine, and electrolytes.  Elevated troponin: As a set they have been flat at 148.  Likely secondary to demand ischemia from uncontrolled hypertension.   Left lower extremity cellulitis: Chronic. It is of several months duration. Wounds to the medical aspect of the distal left lower extremity. The patient is receiving IV vancomycin and Rocephin. Wound care and general surgery have been consulted for the wounds.  Acute kidney injury: Unknown how much of this may be chronic. Certainly it is in part due to uncontrolled hypertension. No prior creatinine for comparison. Creatinine of 1.73 on admission. Monitor creatinine, electrolytes, and volume status. Avoid nephrotoxic substances and hypotension.  I have seen and examined this patient myself. I have spent 38 minutes in his evaluation and care. More than 50% of this has been spent in coordination of care with wound care, surgery, and case management.  DVT prophylaxis:Lovenox Code Status:Full Family Communication: Plan discussed with patient at bedside  disposition Plan: Home with at least 2 midnight stays   Benjiman Sedgwick, DO [facility] Direct contact: see  [email or web address]  7PM-7AM contact night coverage as above 10/17/2019, 3:21 PM  LOS: 0 days

## 2019-10-17 NOTE — ED Notes (Signed)
Assisted to side of bed to use urinal.

## 2019-10-17 NOTE — Consult Note (Signed)
[place] Nurse wound consult note Reason for Consult: left lower extremity infected wound Wound type: Patient states the left medial malleolus wound started months ago as a "sunburn".  He states he has been to medical providers, put various types of creams/ointments on it, and it has never healed.  Etiology unclear. Measurement: The largest wound that extends towards the achilles tendon area, measures 4.8 cm x 4.3 cm is necrotic, has purulent discharge, is very painful to touch, and has a heavy foul odor.  The smaller, more medial wound measures 1 cm x 1.3cm is dried and crusted, without drainage. Periwound: There are splotches of maroon dermal discoloration in the inner lower leg. Dressing procedure/placement/frequency: Santyl daily with saline moistened gauze.  My PRIMARY recommendation to Dr. Benny Lennert by SecureChat is to order a surgical and infectious disease consult. Monitor the wound area(s) for worsening of condition such as: Signs/symptoms of infection,  Increase in size,  Development of or worsening of odor, Development of pain, or increased pain at the affected locations.  Notify the medical team if any of these develop.  Thank you for the consult.  Discussed plan of care with the patient.  [place] nurse will not follow at this time.  Please re-consult the [facility] team if needed.  Val Riles, RN, MSN, CWOCN, CNS-BC, pager [phone]

## 2019-10-17 NOTE — Progress Notes (Signed)
*  PRELIMINARY RESULTS* Echocardiogram 2D Echocardiogram has been performed.  Sherrie Sport 10/17/2019, 10:02 AM

## 2019-10-18 ENCOUNTER — Encounter: Payer: Self-pay | Admitting: Cardiology

## 2019-10-18 DIAGNOSIS — I5021 Acute systolic (congestive) heart failure: Secondary | ICD-10-CM

## 2019-10-18 DIAGNOSIS — N179 Acute kidney failure, unspecified: Secondary | ICD-10-CM

## 2019-10-18 DIAGNOSIS — I160 Hypertensive urgency: Secondary | ICD-10-CM

## 2019-10-18 LAB — BASIC METABOLIC PANEL
Anion gap: 8 (ref 5–15)
BUN: 26 mg/dL — ABNORMAL HIGH (ref 6–20)
CO2: 25 mmol/L (ref 22–32)
Calcium: 8.2 mg/dL — ABNORMAL LOW (ref 8.9–10.3)
Chloride: 105 mmol/L (ref 98–111)
Creatinine, Ser: 1.37 mg/dL — ABNORMAL HIGH (ref 0.61–1.24)
GFR calc Af Amer: >60 mL/min (ref >60)
GFR calc non Af Amer: 56 mL/min — ABNORMAL LOW (ref >60)
Glucose, Bld: 124 mg/dL — ABNORMAL HIGH (ref 70–99)
Potassium: 3.4 mmol/L — ABNORMAL LOW (ref 3.5–5.1)
Sodium: 138 mmol/L (ref 135–145)

## 2019-10-18 LAB — TSH: TSH: 2.397 u[IU]/mL (ref 0.350–4.500)

## 2019-10-18 MED ORDER — HYDRALAZINE HCL 25 MG PO TABS
25.0000 mg | ORAL_TABLET | Freq: Three times a day (TID) | ORAL | Status: DC
  Administered 2019-10-18 (×2): 25 mg via ORAL
  Filled 2019-10-18 (×2): qty 1

## 2019-10-18 MED ORDER — CARVEDILOL 6.25 MG PO TABS
6.2500 mg | ORAL_TABLET | Freq: Two times a day (BID) | ORAL | Status: DC
  Administered 2019-10-18: 6.25 mg via ORAL
  Filled 2019-10-18: qty 1

## 2019-10-18 MED ORDER — POTASSIUM CHLORIDE CRYS ER 20 MEQ PO TBCR
20.0000 meq | EXTENDED_RELEASE_TABLET | Freq: Once | ORAL | Status: AC
  Administered 2019-10-18: 20 meq via ORAL
  Filled 2019-10-18: qty 1

## 2019-10-18 MED ORDER — ISOSORBIDE MONONITRATE ER 60 MG PO TB24
60.0000 mg | ORAL_TABLET | Freq: Every day | ORAL | Status: DC
  Administered 2019-10-18: 60 mg via ORAL
  Filled 2019-10-18: qty 1

## 2019-10-18 NOTE — Consult Note (Signed)
CARDIOLOGY CONSULT NOTE  Patient ID: Tristan Villegas MRN: [id number] DOB/AGE: 1961/09/11 58 y.o.  Admit date: 10/16/2019 Primary Physician Does not remember the name. Primary Cardiologist None Chief Complaint  Chest pain Requesting  Dr. Gerri Lins  HPI:   He presented yesterday with a foot ulder . He had a mechanical fall.  Is also concerned because he has had of foot wound.  This developed when he had a little trauma to it and slowly worsened.  He was having significant pain with this.  He was found to have an elevated BNP and CXR with edema.  In the ED his BP was markedly elevated. (201/142) High sensitivity trop was very mildly elevated and with flat trend.  (143 x 2) .  Creat is mildly elevated.  He had been out of meds secondary to losing his job.  He was treated with hydralazine, Norvasc and IV Lasix.  He was also treated with IV Labetalol.   We are consulted by Dr. Gerri Lins because the patient is found to have a newly reduced EF of 20%.     He reports that he is not had any new shortness of breath.  He says he walks with his fiance routinely. The patient denies any new symptoms such as chest discomfort, neck or arm discomfort. There has been no new shortness of breath, PND or orthopnea. There have been no reported palpitations, presyncope or syncope.  However, he has had some mild lower extremity edema.  He says his weights been going down about 30 pounds since Covid started.  He is not had any cough fevers or chills.  He does not follow with a physician very frequently.  He was supposed to be on medications but he does not recall which ones.  He does weigh himself he says but he does not take his blood pressure.  He is being seen by surgery for a left lower extremity ulcer.  He is having wound management for now but might need debridement.  He is on IV Vanc.     Past Medical History:  Diagnosis Date  . Hypertension     PSH Repair of the night wound suffered in his 20s in his abdomen.   Not on File No medications prior to admission.   No family history on file.   There is no family history of early coronary disease or other vascular disease.  Social History   Socioeconomic History  . Marital status: Single    Spouse name: Not on file  . Number of children: Not on file  . Years of education: Not on file  . Highest education level: Not on file  Occupational History  . Not on file  Social Needs  . Financial resource strain: Not hard at all  . Food insecurity    Worry: Never true    Inability: Never true  . Transportation needs    Medical: No    Non-medical: No  Tobacco Use  . Smoking status: Never Smoker  . Smokeless tobacco: Never Used  Substance and Sexual Activity  . Alcohol use: Not Currently  . Drug use: Not Currently  . Sexual activity: Yes    Partners: Female  Lifestyle  . Physical activity    Days per week: 1 day    Minutes per session: 60 min  . Stress: Only a little  Relationships  . Social connections    Talks on phone: Patient refused    Gets together: Patient refused    Attends religious service:  Patient refused    Active member of club or organization: Patient refused    Attends meetings of clubs or organizations: Patient refused    Relationship status: Patient refused  . Intimate partner violence    Fear of current or ex partner: No    Emotionally abused: No    Physically abused: No    Forced sexual activity: No  Other Topics Concern  . Not on file  Social History Narrative   Medical sales representative at [employer]:    As stated in the HPI and negative for all other systems.  Physical Exam: Blood pressure (!) 187/116, pulse 86, temperature 97.8 F (36.6 C), temperature source Oral, resp. rate 20, height 6' (1.829 m), weight 97.5 kg, SpO2 99 %.  GENERAL:  Well appearing HEENT:  Pupils equal round and reactive, fundi not visualized, oral mucosa unremarkable NECK:  Positive jugular venous distention, waveform within normal limits,  carotid upstroke brisk and symmetric, no bruits, no thyromegaly LYMPHATICS:  No cervical, inguinal adenopathy LUNGS:  Clear to auscultation bilaterally BACK:  No CVA tenderness CHEST:  Unremarkable HEART:  PMI not displaced or sustained,S1 and S2 within normal limits, no S3, no S4, no clicks, no rubs, no murmurs ABD:  Flat, positive bowel sounds normal in frequency in pitch, no bruits, no rebound, no guarding, no midline pulsatile mass, no hepatomegaly, no splenomegaly EXT:  2 plus pulses throughout, no edema, no cyanosis no clubbing, bandaged ulcer on the left medial ankle SKIN:  No rashes no nodules NEURO:  Cranial nerves II through XII grossly intact, motor grossly intact throughout PSYCH:  Cognitively intact, oriented to person place and time    Labs: Lab Results  Component Value Date   BUN 26 (H) 10/18/2019   Lab Results  Component Value Date   CREATININE 1.37 (H) 10/18/2019   Lab Results  Component Value Date   NA 138 10/18/2019   K 3.4 (L) 10/18/2019   CL 105 10/18/2019   CO2 25 10/18/2019   No results found for: TROPONINI Lab Results  Component Value Date   WBC 9.9 10/17/2019   HGB 14.2 10/17/2019   HCT 45.7 10/17/2019   MCV 90.5 10/17/2019   PLT 180 10/17/2019   No results found for: CHOL, HDL, LDLCALC, LDLDIRECT, TRIG, CHOLHDL Lab Results  Component Value Date   ALT 42 10/16/2019   AST 46 (H) 10/16/2019   ALKPHOS 60 10/16/2019   BILITOT 1.2 10/16/2019   ECHO:    1. Left ventricular ejection fraction, by visual estimation, is 20 to 25%. The left ventricle has severely decreased function. There is moderately increased left ventricular hypertrophy.  2. Mildly dilated left ventricular internal cavity size.  3. The left ventricle demonstrates global hypokinesis.  4. Global right ventricle has normal systolic function.The right ventricular size is normal. No increase in right ventricular wall thickness.  5. Left atrial size was mildly dilated.  6. Small  pericardial effusion.  7. TR signal is inadequate for assessing pulmonary artery systolic pressure.   Radiology:   CXR: Cardiomegaly with vascular congestion  EKG:  NSR, rate 87, axis WNL, intervals WNL.  No acute ST T wave changes.     ASSESSMENT AND PLAN:   ACUTE SYSTOLIC HF:    Echo images reviewed for this consult.  Severely reduced EF with global hypokinesis.  Moderate LVH.  Likely etiology is hypertension but will need eventually an ischemia work-up.  Stop amlodipine.  I think you will tolerate initiation of  carvedilol.  I will add nitrates.  He is on hydralazine.  We talked initially about salt and fluid restriction.  I Ernie Hew do an ischemia work-up although I do not strongly suspect this is the etiology.  Try to avoid contrast and so he will have a [employer].  I will order this for Monday.  HIV was negative.  I will order a TSH.  We can consider other etiologies based on the ischemia work-up.  HYPERTENSIVE URGENCY:  This is being managed in the context of treating his CHF  AKI:    Follow and eventually, as were sure that the creatinine is coming down we can switch to likely Entresto although cost will be a consideration.  I will consider this tomorrow.     SignedRollene Rotunda 10/18/2019, 12:24 PM   \

## 2019-10-18 NOTE — Consult Note (Signed)
SURGICAL CONSULTATION NOTE   HISTORY OF PRESENT ILLNESS (HPI):  58 y.o. male presented to [facility] ED for evaluation of fall yesterday.  At the ED he was found to have severe hypertension.  Patient reported that he has not been taking his pain medication.  As a full physical exam he was found with an ulcer on the left lower extremity.  She was found to be foul-smelling and tender.  The pain does not radiate to other part of the body.  Aggravating factor is applying pressure over the ulcer.  There is no alleviating factor.  The patient reported that he has had this ulcer for more than a month.  Patient does not recall seeing a specialist for this matter.  Surgery is consulted by Dr. Gerri Lins in this context for evaluation and management of left lower extremity ulcer.  PAST MEDICAL HISTORY (PMH):  Hypertension  PAST SURGICAL HISTORY (PSH):  History reviewed, no pertinent past surgical history.  MEDICATIONS:  Prior to Admission medications   Not on File     ALLERGIES:  Not on File   SOCIAL HISTORY:  Social History   Socioeconomic History  . Marital status: Single    Spouse name: Not on file  . Number of children: Not on file  . Years of education: Not on file  . Highest education level: Not on file  Occupational History  . Not on file  Social Needs  . Financial resource strain: Not hard at all  . Food insecurity    Worry: Never true    Inability: Never true  . Transportation needs    Medical: No    Non-medical: No  Tobacco Use  . Smoking status: Unknown If Ever Smoked  . Smokeless tobacco: Never Used  Substance and Sexual Activity  . Alcohol use: Not Currently  . Drug use: Not Currently  . Sexual activity: Yes    Partners: Female  Lifestyle  . Physical activity    Days per week: 1 day    Minutes per session: 60 min  . Stress: Only a little  Relationships  . Social Musician on phone: Patient refused    Gets together: Patient refused    Attends religious  service: Patient refused    Active member of club or organization: Patient refused    Attends meetings of clubs or organizations: Patient refused    Relationship status: Patient refused  . Intimate partner violence    Fear of current or ex partner: No    Emotionally abused: No    Physically abused: No    Forced sexual activity: No  Other Topics Concern  . Not on file  Social History Narrative  . Not on file    The patient currently resides (home / rehab facility / nursing home): Home The patient normally is (ambulatory / bedbound): Ambulatory   FAMILY HISTORY:  No family history on file.   REVIEW OF SYSTEMS:  Constitutional: denies weight loss, fever, chills, or sweats  Eyes: denies any other vision changes, history of eye injury  ENT: denies sore throat, hearing problems  Respiratory: Positive shortness of breath, wheezing  Cardiovascular: Positive chest pain, palpitations  Gastrointestinal: denies abdominal pain, N/V, or diarrhea Genitourinary: denies burning with urination or urinary frequency Musculoskeletal: denies any other joint pains or cramps  Skin: denies any other rashes or skin discolorations  Neurological: denies any other headache, dizziness, weakness  Psychiatric: denies any other depression, anxiety   All other review of systems were  negative   VITAL SIGNS:  Temp:  [97.5 F (36.4 C)-99.2 F (37.3 C)] 97.8 F (36.6 C) (11/07 0744) Pulse Rate:  [67-95] 79 (11/07 0744) Resp:  [18-20] 20 (11/07 0744) BP: (120-201)/(65-138) 188/128 (11/07 0744) SpO2:  [93 %-99 %] 99 % (11/07 0744) Weight:  [97.5 kg] 97.5 kg (11/07 0346)     Height: 6' (182.9 cm) Weight: 97.5 kg BMI (Calculated): 29.14   INTAKE/OUTPUT:  This shift: Total I/O In: -  Out: 350 [Urine:350]  Last 2 shifts: @IOLAST2SHIFTS @   PHYSICAL EXAM:  Constitutional:  -- Normal body habitus  -- Awake, alert, and oriented x3  Eyes:  -- Pupils equally round and reactive to light  -- No scleral icterus   Ear, nose, and throat:  -- No jugular venous distension  Pulmonary:  -- No crackles  -- Equal breath sounds bilaterally -- Breathing non-labored at rest Cardiovascular:  -- S1, S2 present  -- No pericardial rubs Gastrointestinal:  -- Abdomen soft, nontender, non-distended, no guarding or rebound tenderness -- No abdominal masses appreciated, pulsatile or otherwise  Musculoskeletal and Integumentary:  -- Wounds or skin discoloration: Left lower extremity wound with necrotic patch.  Fibrinous tissue below the eschar.  Minimal associated cellulitis. -- Extremities: Bilateral lower extremity pitting edema +3.  There is no crepitance. -- Motor function: intact and symmetric -- Sensation: intact and symmetric       Labs:  CBC Latest Ref Rng & Units 10/17/2019 10/16/2019  WBC 4.0 - 10.5 K/uL 9.9 10.6(H)  Hemoglobin 13.0 - 17.0 g/dL 00.7 62.2  Hematocrit 63.3 - 52.0 % 45.7 48.7  Platelets 150 - 400 K/uL 180 201   CMP Latest Ref Rng & Units 10/18/2019 10/17/2019 10/16/2019  Glucose 70 - 99 mg/dL 354(T) 625(W) 389(H)  BUN 6 - 20 mg/dL 73(S) 28(J) 68(T)  Creatinine 0.61 - 1.24 mg/dL 1.57(W) 6.20(B) 5.59(R)  Sodium 135 - 145 mmol/L 138 142 141  Potassium 3.5 - 5.1 mmol/L 3.4(L) 3.9 4.2  Chloride 98 - 111 mmol/L 105 109 108  CO2 22 - 32 mmol/L 25 24 22   Calcium 8.9 - 10.3 mg/dL 8.2(L) 8.7(L) 9.0  Total Protein 6.5 - 8.1 g/dL - - 7.0  Total Bilirubin 0.3 - 1.2 mg/dL - - 1.2  Alkaline Phos 38 - 126 U/L - - 60  AST 15 - 41 U/L - - 46(H)  ALT 0 - 44 U/L - - 42     Imaging studies:  I evaluated the images of the left foot x-ray.  There is no sign of osteomyelitis.  Assessment/Plan:  58 y.o. male with chronic left leg ulcer, complicated by pertinent comorbidities including acute congestive heart failure due to hypertensive urgency. Patient evaluated and found with chronic left lower extremity ulcer.  The ulcer showed necrotic tissue.  The eschar was able to be debrided bedside but due  to pain deeper debridement was unable to be completed.  At this moment the patient is not a good candidate for anesthesia due to persistent uncontrolled hypertension.  I agree with current management with Santyl collagenase.  If patient does not respond to Santyl collagenase after the removal of the eschar and the blood pressure is controlled he might benefit of deeper debridement.  Most importantly patient will benefit of vascular specialist evaluation to further assess arterial and venous disease degree.  Clinically patient is CEAP 6 with active ulceration.  Most of these ulcers will need Unna's boot because the swelling of the leg will not let the ulcer heal.  We will continue to follow patient to see how he responds to the Santyl collagenase and if very optimize for surgery and responded to chemical debridement will consider surgical debridement.  Arnold Long, MD

## 2019-10-18 NOTE — Progress Notes (Signed)
PROGRESS NOTE  Tristan Villegas ZJI:[id number] DOB: September 28, 1961 DOA: 10/16/2019 PCP: Patient, No Pcp Per  Brief History   The patient is a 58 yr old man who presented to the [facility] ED last night with complaints of shortness of breath and right sided rib pain. He was found to be profoundly hypertensive and hypoxic. The patient had not taken his antihypertensives for the past several weeks since losing his job and his insurance. He had a BNP of greater than 3000 and an elevated but flat set of troponins. Creatinine was elevated at 1.73, but no prior creatinines were available for comparison. CXR demonstrated cardiomegaly and vascular congestion. He had bilateral lower extremity edema. The patient was also found to have left lower extremity cellullitis and chronic wounds to the medial aspect of the distal left lower extremity.   He has been admitted to a telemetry bed. He has been started on Amlodipine, hydralazine, and lasix. He is also receiving IV Vancomycin for left lower extremity cellulitis and wounds. Wound care has been consulted as had general surgery. Echocardiogram has demonstrated an EF of 20% and global hypokinesis. Cardiology has been consulted.  Consultants  . Wound Care . General Surgery . Case management.  Procedures  . None  Antibiotics   Anti-infectives (From admission, onward)   Start     Dose/Rate Route Frequency Ordered Stop   10/18/19 0200  vancomycin (VANCOCIN) 1,250 mg in sodium chloride 0.9 % 250 mL IVPB  Status:  Discontinued     1,250 mg 166.7 mL/hr over 90 Minutes Intravenous Every 24 hours 10/17/19 0416 10/17/19 0815   10/18/19 0200  vancomycin (VANCOCIN) 1,250 mg in sodium chloride 0.9 % 250 mL IVPB     1,250 mg 166.7 mL/hr over 90 Minutes Intravenous Every 24 hours 10/17/19 0815     10/17/19 0330  cefTRIAXone (ROCEPHIN) 1 g in sodium chloride 0.9 % 100 mL IVPB     1 g 200 mL/hr over 30 Minutes Intravenous Every 24 hours 10/17/19 0328     10/17/19 0115  vancomycin  (VANCOCIN) 2,000 mg in sodium chloride 0.9 % 500 mL IVPB     2,000 mg 250 mL/hr over 120 Minutes Intravenous  Once 10/17/19 0113 10/17/19 0437   10/17/19 0045  vancomycin (VANCOCIN) IVPB 1000 mg/200 mL premix  Status:  Discontinued     1,000 mg 200 mL/hr over 60 Minutes Intravenous  Once 10/17/19 0039 10/17/19 0113     Subjective  The patient is resting quietly. No new complaints.  Objective   Vitals:  Vitals:   10/18/19 1257 10/18/19 1559  BP: (!) 161/105 (!) 164/98  Pulse: 75 75  Resp:  18  Temp:  97.9 F (36.6 C)  SpO2:  97%   Exam:  Constitutional:  The patient is awake, alert, and oriented x 3.No acute distress. Respiratory:  . No increased work of breathing. Marland Kitchen Posiitve for diffuse rales and wheezes. No rhonchi . No tactile fremitus Cardiovascular:  . Regular rate and rhythm . No murmurs, ectopy, or gallups. . No lateral PMI. No thrills. Abdomen:  . Abdomen is soft, non-tender, non-distended . No hernias, masses, or organomegaly . Normoactive bowel sounds.  Musculoskeletal:  . No cyanosis or clubbing. +2 pitting edema. Skin:  . No rashes, lesions, ulcers . palpation of skin: no induration or nodules Neurologic:  . CN 2-12 intact . Sensation all 4 extremities intact Psychiatric:  . Mental status o Mood, affect appropriate o Orientation to person, place, time  . judgment and insight appear  intact  I have personally reviewed the following:   Today's Data  . Vitals, BMP, CBC, BNP, Troponin, EKG  Scheduled Meds: . carvedilol  6.25 mg Oral BID WC  . collagenase   Topical Daily  . enoxaparin (LOVENOX) injection  40 mg Subcutaneous Q24H  . furosemide  40 mg Intravenous Daily  . hydrALAZINE  25 mg Oral Q8H  . isosorbide mononitrate  60 mg Oral Daily   Continuous Infusions: . cefTRIAXone (ROCEPHIN)  IV 1 g (10/18/19 0521)  . vancomycin 1,250 mg (10/18/19 0340)    Principal Problem:   Acute CHF (congestive heart failure) (HCC) Active Problems:    Hypertensive urgency   AKI (acute kidney injury) ([place])   Lower extremity cellulitis   LOS: 1 day   A & P  Hypertensive Urgency: Systolic Blood pressures in the 200's on admission with diastolic pressures in the 90-100's.Patient with blood pressure over 201/142 on admission secondary to missing his antihypertensive due to financial constraints. Improved with amlodipine and hydralazine. Pulmonary vascular congestion on CXR with hypoxia. Case management consulted to assist the patient with obtaining his medications.   Acute congestive heart failure: LVEF of 20% with global hypokinesis. Cardiology has been consuted. Pt is responding to diuresis with negative 2.8 L fluid balance. Continue to diurese.  Monitor volume status, creatinine, and electrolytes.  Elevated troponin: As a set they have been flat at 148.  Likely secondary to demand ischemia from uncontrolled hypertension.   Left lower extremity cellulitis: Chronic. It is of several months duration.The patient is receiving IV vancomycin and Rocephin.    Wounds to the medical aspect of the distal left lower extremity: Wound care and general surgery have been consulted. He is receiving IV Vancomycin and Rocephin.  Acute kidney injury: Unknown how much of this may be chronic. Certainly it is in part due to uncontrolled hypertension. No prior creatinine for comparison. Creatinine of 1.73 on admission. Now 1.37. Monitor creatinine, electrolytes, and volume status. Avoid nephrotoxic substances and hypotension.  I have seen and examined this patient myself. I have spent 42 minutes in his evaluation and care. More than 50% of this has been spent in coordination of care with cardiology.  DVT prophylaxis:Lovenox Code Status:Full Family Communication: Plan discussed with patient at bedside  disposition Plan: Home with at least 2 midnight stays   Tristan Dettmann, DO [facility] Direct contact: see [email or web address]  7PM-7AM contact night coverage  as above 10/18/2019, 5:35 PM  LOS: 0 days

## 2019-10-19 ENCOUNTER — Other Ambulatory Visit: Payer: Self-pay

## 2019-10-19 ENCOUNTER — Emergency Department
Admission: EM | Admit: 2019-10-19 | Discharge: 2019-10-19 | Disposition: A | Discharge status: Home / Self Care | Payer: Self-pay | Attending: Emergency Medicine | Admitting: Emergency Medicine

## 2019-10-19 DIAGNOSIS — N179 Acute kidney failure, unspecified: Secondary | ICD-10-CM

## 2019-10-19 DIAGNOSIS — I501 Left ventricular failure: Secondary | ICD-10-CM

## 2019-10-19 DIAGNOSIS — I5021 Acute systolic (congestive) heart failure: Secondary | ICD-10-CM

## 2019-10-19 DIAGNOSIS — S81802D Unspecified open wound, left lower leg, subsequent encounter: Secondary | ICD-10-CM | POA: Insufficient documentation

## 2019-10-19 DIAGNOSIS — L03116 Cellulitis of left lower limb: Secondary | ICD-10-CM

## 2019-10-19 DIAGNOSIS — I110 Hypertensive heart disease with heart failure: Secondary | ICD-10-CM | POA: Insufficient documentation

## 2019-10-19 DIAGNOSIS — I10 Essential (primary) hypertension: Secondary | ICD-10-CM | POA: Insufficient documentation

## 2019-10-19 DIAGNOSIS — R404 Transient alteration of awareness: Secondary | ICD-10-CM | POA: Insufficient documentation

## 2019-10-19 DIAGNOSIS — X58XXXD Exposure to other specified factors, subsequent encounter: Secondary | ICD-10-CM | POA: Insufficient documentation

## 2019-10-19 DIAGNOSIS — J9601 Acute respiratory failure with hypoxia: Secondary | ICD-10-CM

## 2019-10-19 DIAGNOSIS — I160 Hypertensive urgency: Secondary | ICD-10-CM

## 2019-10-19 DIAGNOSIS — F323 Major depressive disorder, single episode, severe with psychotic features: Secondary | ICD-10-CM

## 2019-10-19 DIAGNOSIS — I509 Heart failure, unspecified: Secondary | ICD-10-CM | POA: Insufficient documentation

## 2019-10-19 LAB — SALICYLATE LEVEL: Salicylate Lvl: <7 mg/dL (ref 2.8–30.0)

## 2019-10-19 LAB — CBC
HCT: 46.5 % (ref 39.0–52.0)
Hemoglobin: 14.6 g/dL (ref 13.0–17.0)
MCH: 28.1 pg (ref 26.0–34.0)
MCHC: 31.4 g/dL (ref 30.0–36.0)
MCV: 89.6 fL (ref 80.0–100.0)
Platelets: 224 10*3/uL (ref 150–400)
RBC: 5.19 MIL/uL (ref 4.22–5.81)
RDW: 14.6 % (ref 11.5–15.5)
WBC: 11 10*3/uL — ABNORMAL HIGH (ref 4.0–10.5)
nRBC: 0 % (ref 0.0–0.2)

## 2019-10-19 LAB — COMPREHENSIVE METABOLIC PANEL
ALT: 35 U/L (ref 0–44)
AST: 33 U/L (ref 15–41)
Albumin: 3.4 g/dL — ABNORMAL LOW (ref 3.5–5.0)
Alkaline Phosphatase: 54 U/L (ref 38–126)
Anion gap: 14 (ref 5–15)
BUN: 21 mg/dL — ABNORMAL HIGH (ref 6–20)
CO2: 26 mmol/L (ref 22–32)
Calcium: 8.8 mg/dL — ABNORMAL LOW (ref 8.9–10.3)
Chloride: 99 mmol/L (ref 98–111)
Creatinine, Ser: 1.47 mg/dL — ABNORMAL HIGH (ref 0.61–1.24)
GFR calc Af Amer: 59 mL/min — ABNORMAL LOW (ref >60)
GFR calc non Af Amer: 51 mL/min — ABNORMAL LOW (ref >60)
Glucose, Bld: 112 mg/dL — ABNORMAL HIGH (ref 70–99)
Potassium: 3.9 mmol/L (ref 3.5–5.1)
Sodium: 139 mmol/L (ref 135–145)
Total Bilirubin: 1.5 mg/dL — ABNORMAL HIGH (ref 0.3–1.2)
Total Protein: 6.5 g/dL (ref 6.5–8.1)

## 2019-10-19 LAB — ACETAMINOPHEN LEVEL: Acetaminophen (Tylenol), Serum: <10 ug/mL — ABNORMAL LOW (ref 10–30)

## 2019-10-19 LAB — ETHANOL: Alcohol, Ethyl (B): <10 mg/dL (ref <10)

## 2019-10-19 MED ORDER — DROPERIDOL 2.5 MG/ML IJ SOLN
5.0000 mg | Freq: Once | INTRAMUSCULAR | Status: DC | PRN
  Filled 2019-10-19: qty 2

## 2019-10-19 MED ORDER — DOXYCYCLINE HYCLATE 100 MG PO CAPS: 100.0000 mg | ORAL_CAPSULE | Freq: Two times a day (BID) | ORAL | 0 refills | Status: AC

## 2019-10-19 NOTE — ED Notes (Signed)
Attempted to call Tristan Villegas(patients significant other at [phone].  No answer, mailbox full.
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
## 2019-10-19 NOTE — ED Notes (Signed)
Pt given belongings to change for d/c. Will d/c home once family here to transport home.

## 2019-10-19 NOTE — ED Notes (Signed)
Pt. Brought into ED via [employer] under IVC.  Pt. Is unsure why he is here.  It appears patient had walked out of medical floor into parking lot and had called police and stated staff from hospital were chasing him.  Pt. Was still confused when officers arrived believing they were not the police.  Pt. Was also observed walking out into areas where cars were driving.  Pt. States no pain or complaints at this time.  Pt. Calm and cooperative in 20 Hallway bed.

## 2019-10-19 NOTE — Discharge Instructions (Signed)
As we discussed, you left the hospital [place] during the night last night and were brought back to the Emergency Department (ED)  by law enforcement based on your behavior.  However you have been calm and cooperative during your time in the emergency department.  Although it would be better for you to have stayed in the hospital for care of your high blood pressure and the leg wound, you appear to have the capacity to make your own decisions.  Please understand that you can return at any time for additional medical evaluation and care.  I strongly recommend that you take your blood pressure medicine that you have at home already as well as The new prescription for antibiotics for the wound on your leg.  I gave you the follow-up information with the surgeon who saw you in the hospital so you can schedule a follow-up appointment in his clinic, and I also gave you information about the wound care center.  They should be reaching out to you to schedule an appointment as well.  If at any point you change your mind and would like additional medical evaluation or if you develop any new or worsening symptoms that concern you, please return to the emergency department.

## 2019-10-19 NOTE — ED Notes (Signed)
Pt. Asking to walk outside and make a phone call to girlfriend at home.  Pt. Advised it was early in morning and we could make a phone call during phone hours.

## 2019-10-19 NOTE — Discharge Summary (Addendum)
Physician Discharge Summary  Tristan Villegas OZH:[id number] DOB: 11/02/1961 DOA: 10/19/2019  PCP: Patient, No Pcp Per  Admit date: 10/19/2019 Discharge date: 10/19/2019  Recommendations for Outpatient Follow-up:  Pt left AMA  Discharge Diagnoses: Principal diagnosis is #1 1. Severe Acute CHF with EF of 20% 2. Hypertensive Urgency 3. Pulmonary edema 4. AKI 5. Depression  Discharge Condition: AMA Disposition: AMA  Diet recommendation: AMA  Filed Weights   10/19/19 0157  Weight: 108.9 kg    History of present illness:  Tristan Villegas is a 58 y.o. male with medical history significant of hypertension who presents with concerns of a fall after tripping over her shoes and having right-sided rib pain.  He was found to be hypertensive on arrival with blood pressure of 201/142 and reportedly was tachypneic with respiration of 24.  Patient reports missing his antihypertensive for the past several weeks due to loss of his job and insurance. Denies any headache, vision changes. Notes right sided rib pain below right breast. Denies shortness of breath, orthopnea or PND.   ED Course: He was afebrile and hypertensive up to 201/142.  CBC showed leukocytosis of 10.6 but no anemia.  CMP showed elevated creatinine of 1.73 without a prior for comparison.  Troponin flat at 143.  BNP of over 3000.  EKG shows normal sinus rhythm. Chest x-ray shows cardiomegaly and vascular congestion.  No visible rib fractures. He was given IV 40mg  Lasix and 10mg  Labetalol.   Hospital Course:  The patient is a 57 yr old man who presented to the [facility] ED last night with complaints of shortness of breath and right sided rib pain. He was found to be profoundly hypertensive and hypoxic. The patient had not taken his antihypertensives for the past several weeks since losing his job and his insurance. He had a BNP of greater than 3000 and an elevated but flat set of troponins. Creatinine was elevated at 1.73, but no prior  creatinines were available for comparison. CXR demonstrated cardiomegaly and vascular congestion. He had bilateral lower extremity edema. The patient was also found to have left lower extremity cellullitis and chronic wounds to the medial aspect of the distal left lower extremity.   He has been admitted to a telemetry bed. He has been started on Amlodipine, hydralazine, and lasix. He is also receiving IV Vancomycin for left lower extremity cellulitis and wounds. Wound care has been consulted as had general surgery. Echocardiogram has demonstrated an EF of 20% and global hypokinesis. Cardiology has been consulted.  Today's assessment: S: AMA O: Vitals:  Vitals:   10/19/19 0155  BP: (!) 173/97  Pulse: 87  Resp: 20  Temp: 98.3 F (36.8 C)   For physical exam on the day patient left AMA please see progress note dated 10/18/2019.  Discharge Instructions  Pt left AMA.   No Known Allergies  The results of significant diagnostics from this hospitalization (including imaging, microbiology, ancillary and laboratory) are listed below for reference.    Significant Diagnostic Studies: Dg Chest 2 View  Result Date: 10/16/2019 CLINICAL DATA:  Fall, bilateral rib pain EXAM: CHEST - 2 VIEW COMPARISON:  None. FINDINGS: Cardiomegaly, vascular congestion. No confluent opacities or effusions. No acute bony abnormality. No visible rib fracture or pneumothorax. IMPRESSION: Cardiomegaly, vascular congestion. Electronically Signed   By: 13/06/2019 M.D.   On: 10/16/2019 21:18   Dg Foot 2 Views Left  Result Date: 10/17/2019 CLINICAL DATA:  Cellulitis EXAM: LEFT FOOT - 2 VIEW COMPARISON:  None. FINDINGS: No acute  bony abnormality. Specifically, no fracture, subluxation, or dislocation. No radiographic changes of osteomyelitis. Calcaneal spur. IMPRESSION: No acute bony abnormality. Electronically Signed   By: Rolm Baptise M.D.   On: 10/17/2019 01:26    Microbiology: Recent Results (from the past 240  hour(s))  SARS CORONAVIRUS 2 (TAT 6-24 HRS) Nasopharyngeal Nasopharyngeal Swab     Status: None   Collection Time: 10/17/19  3:03 AM   Specimen: Nasopharyngeal Swab  Result Value Ref Range Status   SARS Coronavirus 2 NEGATIVE NEGATIVE Final    Comment: (NOTE) SARS-CoV-2 target nucleic acids are NOT DETECTED. The SARS-CoV-2 RNA is generally detectable in upper and lower respiratory specimens during the acute phase of infection. Negative results do not preclude SARS-CoV-2 infection, do not rule out co-infections with other pathogens, and should not be used as the sole basis for treatment or other patient management decisions. Negative results must be combined with clinical observations, patient history, and epidemiological information. The expected result is Negative. Fact Sheet for Patients: SugarRoll.be Fact Sheet for Healthcare Providers: https://www.woods-mathews.com/ This test is not yet approved or cleared by the [place] FDA and  has been authorized for detection and/or diagnosis of SARS-CoV-2 by FDA under an Emergency Use Authorization (EUA). This EUA will remain  in effect (meaning this test can be used) for the duration of the COVID-19 declaration under Section 56 4(b)(1) of the Act, 21 U.S.C. section 360bbb-3(b)(1), unless the authorization is terminated or revoked sooner. Performed at [facility], [place] [address], [place], [place] [address]      Labs: Basic Metabolic Panel: Recent Labs  Lab 10/16/19 2056 10/17/19 0416 10/18/19 0553 10/19/19 0202  NA 141 142 138 139  K 4.2 3.9 3.4* 3.9  CL 108 109 105 99  CO2 22 24 25 26   GLUCOSE 132* 103* 124* 112*  BUN 36* 33* 26* 21*  CREATININE 1.73* 1.76* 1.37* 1.47*  CALCIUM 9.0 8.7* 8.2* 8.8*   Liver Function Tests: Recent Labs  Lab 10/16/19 2056 10/19/19 0202  AST 46* 33  ALT 42 35  ALKPHOS 60 54  BILITOT 1.2 1.5*  PROT 7.0 6.5  ALBUMIN 3.9 3.4*   No  results for input(s): LIPASE, AMYLASE in the last 168 hours. No results for input(s): AMMONIA in the last 168 hours. CBC: Recent Labs  Lab 10/16/19 2056 10/17/19 0416 10/19/19 0202  WBC 10.6* 9.9 11.0*  HGB 15.5 14.2 14.6  HCT 48.7 45.7 46.5  MCV 89.7 90.5 89.6  PLT 201 180 224   Cardiac Enzymes: No results for input(s): CKTOTAL, CKMB, CKMBINDEX, TROPONINI in the last 168 hours. BNP: BNP (last 3 results) Recent Labs    10/16/19 2207  BNP 2,005.0*    ProBNP (last 3 results) No results for input(s): PROBNP in the last 8760 hours.  CBG: No results for input(s): GLUCAP in the last 168 hours.    Time coordinating discharge: 38 minutes.  Signed:        Emalynn Clewis, DO [facility]  10/19/2019, 7:45 AM

## 2019-10-19 NOTE — ED Notes (Signed)
SOC called report given, pt. Escorted to interview room.  Pt. Talking to [facility].

## 2019-10-19 NOTE — ED Notes (Signed)
Pt fiance asking about pt belongings. Pt states left them outside next to tree prior to arrival to ED per pt. Fiance signed d/c paperwork for pt and d/c in her care. Acknowledged instructions.

## 2019-10-19 NOTE — ED Notes (Signed)
SOC complete, pt. Moved back to 20 hallway bed.

## 2019-10-19 NOTE — ED Triage Notes (Signed)
Patient was discharged from 2A about an hour ago. Patient was brought back to ED via [employer] with IVC papers because they found him walking in the woods, stated he thought we were trying to kill him. He states that the told them he "didn't feel safe here, didn't know anybody." Patient is able to answer questions appropriately. Does not have a desire to kill himself or anyone else.

## 2019-10-19 NOTE — ED Notes (Signed)
Called significant other, she reports on her way to transport pt home. Karma Greaser MD notified.

## 2019-10-19 NOTE — Progress Notes (Signed)
Patient left unit without notifying staff and removed tele-box. Patient found by this RN and escorted back to unit. Patient stated that he "just wanted to leave and didn't understand why we're trying to keep him here." Notified Rosana Hoes and NP [place] of situation. Patient signed AMA paperwork, IVs removed, and all personal belongings given back to patient. Patient escorted to [place] entrance to be picked up in private vehicle.  Earleen Reaper, RN

## 2019-10-19 NOTE — ED Notes (Signed)
MD at bedside. 

## 2019-10-19 NOTE — ED Notes (Signed)
Pt. Tried to walk past security guard, security guard redirected patient to hallway bed.  Pt. Advised it was unsafe to walk around in ED, pt. Advised he could walk in hallway of quad area.

## 2019-10-19 NOTE — Discharge Summary (Deleted)
  The note originally documented on this encounter has been moved the the encounter in which it belongs.  

## 2019-10-22 LAB — CULTURE, BLOOD (ROUTINE X 2)
Culture: NO GROWTH
Culture: NO GROWTH

## 2019-10-31 ENCOUNTER — Ambulatory Visit: Payer: Self-pay | Admitting: Family

## 2019-11-10 ENCOUNTER — Ambulatory Visit: Payer: Self-pay | Admitting: Physician Assistant

## 2019-11-14 ENCOUNTER — Ambulatory Visit: Payer: Self-pay | Admitting: Family

## 2019-12-15 ENCOUNTER — Ambulatory Visit: Payer: Self-pay | Admitting: Family

## 2020-01-02 ENCOUNTER — Ambulatory Visit: Payer: Self-pay | Admitting: Family

## 2020-01-27 ENCOUNTER — Ambulatory Visit: Payer: Self-pay | Admitting: Family

## 2020-04-05 IMAGING — CR DG CHEST 2V
1 series · 2 of 2 positions shown · non-contrast
Comparison: None.

CLINICAL DATA: Fall, bilateral rib pain

EXAM:
CHEST - 2 VIEW

[Series 1: dg chest 2 view · 0.14mm/px · 2 of 2 slices shown]
[im 1/2]
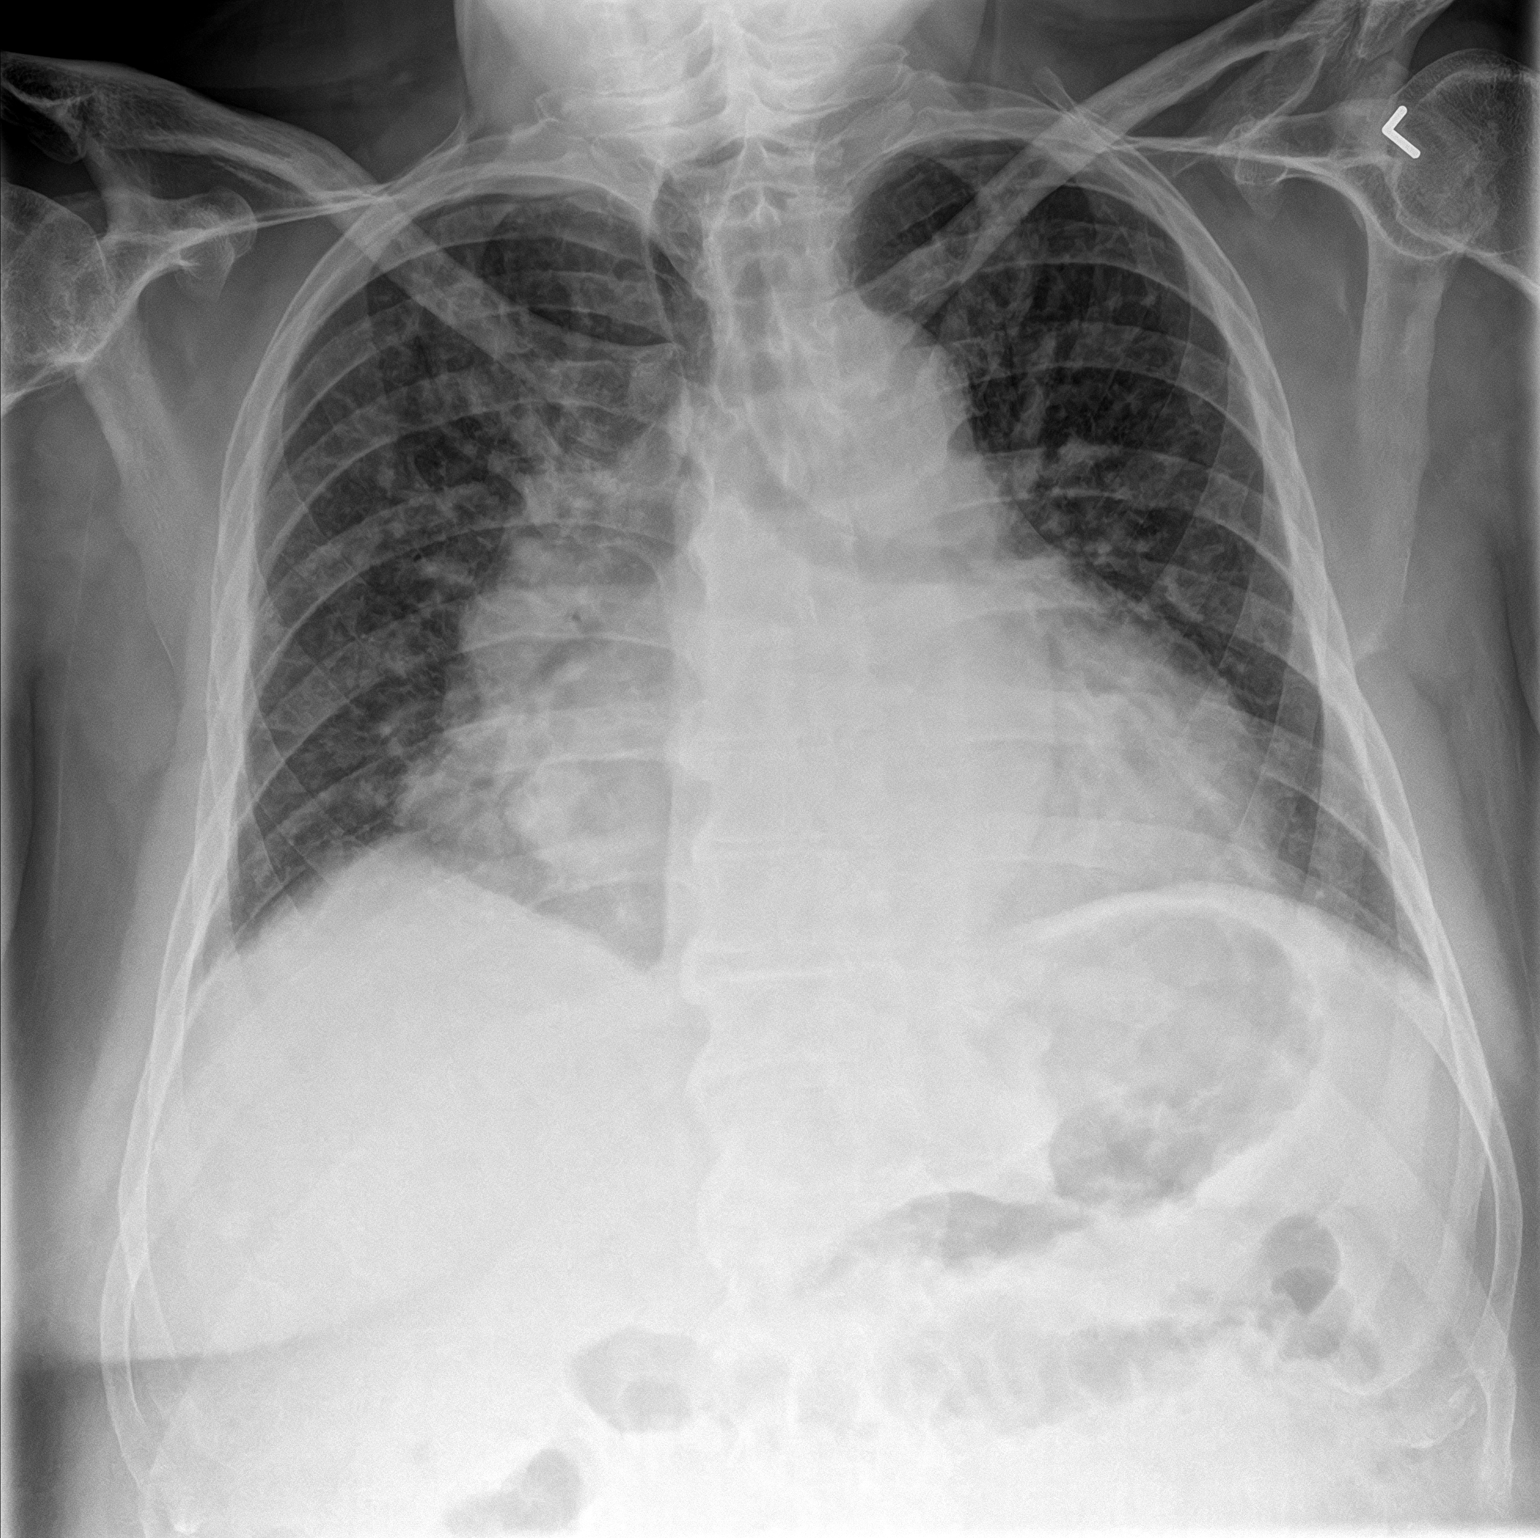
[im 2/2]
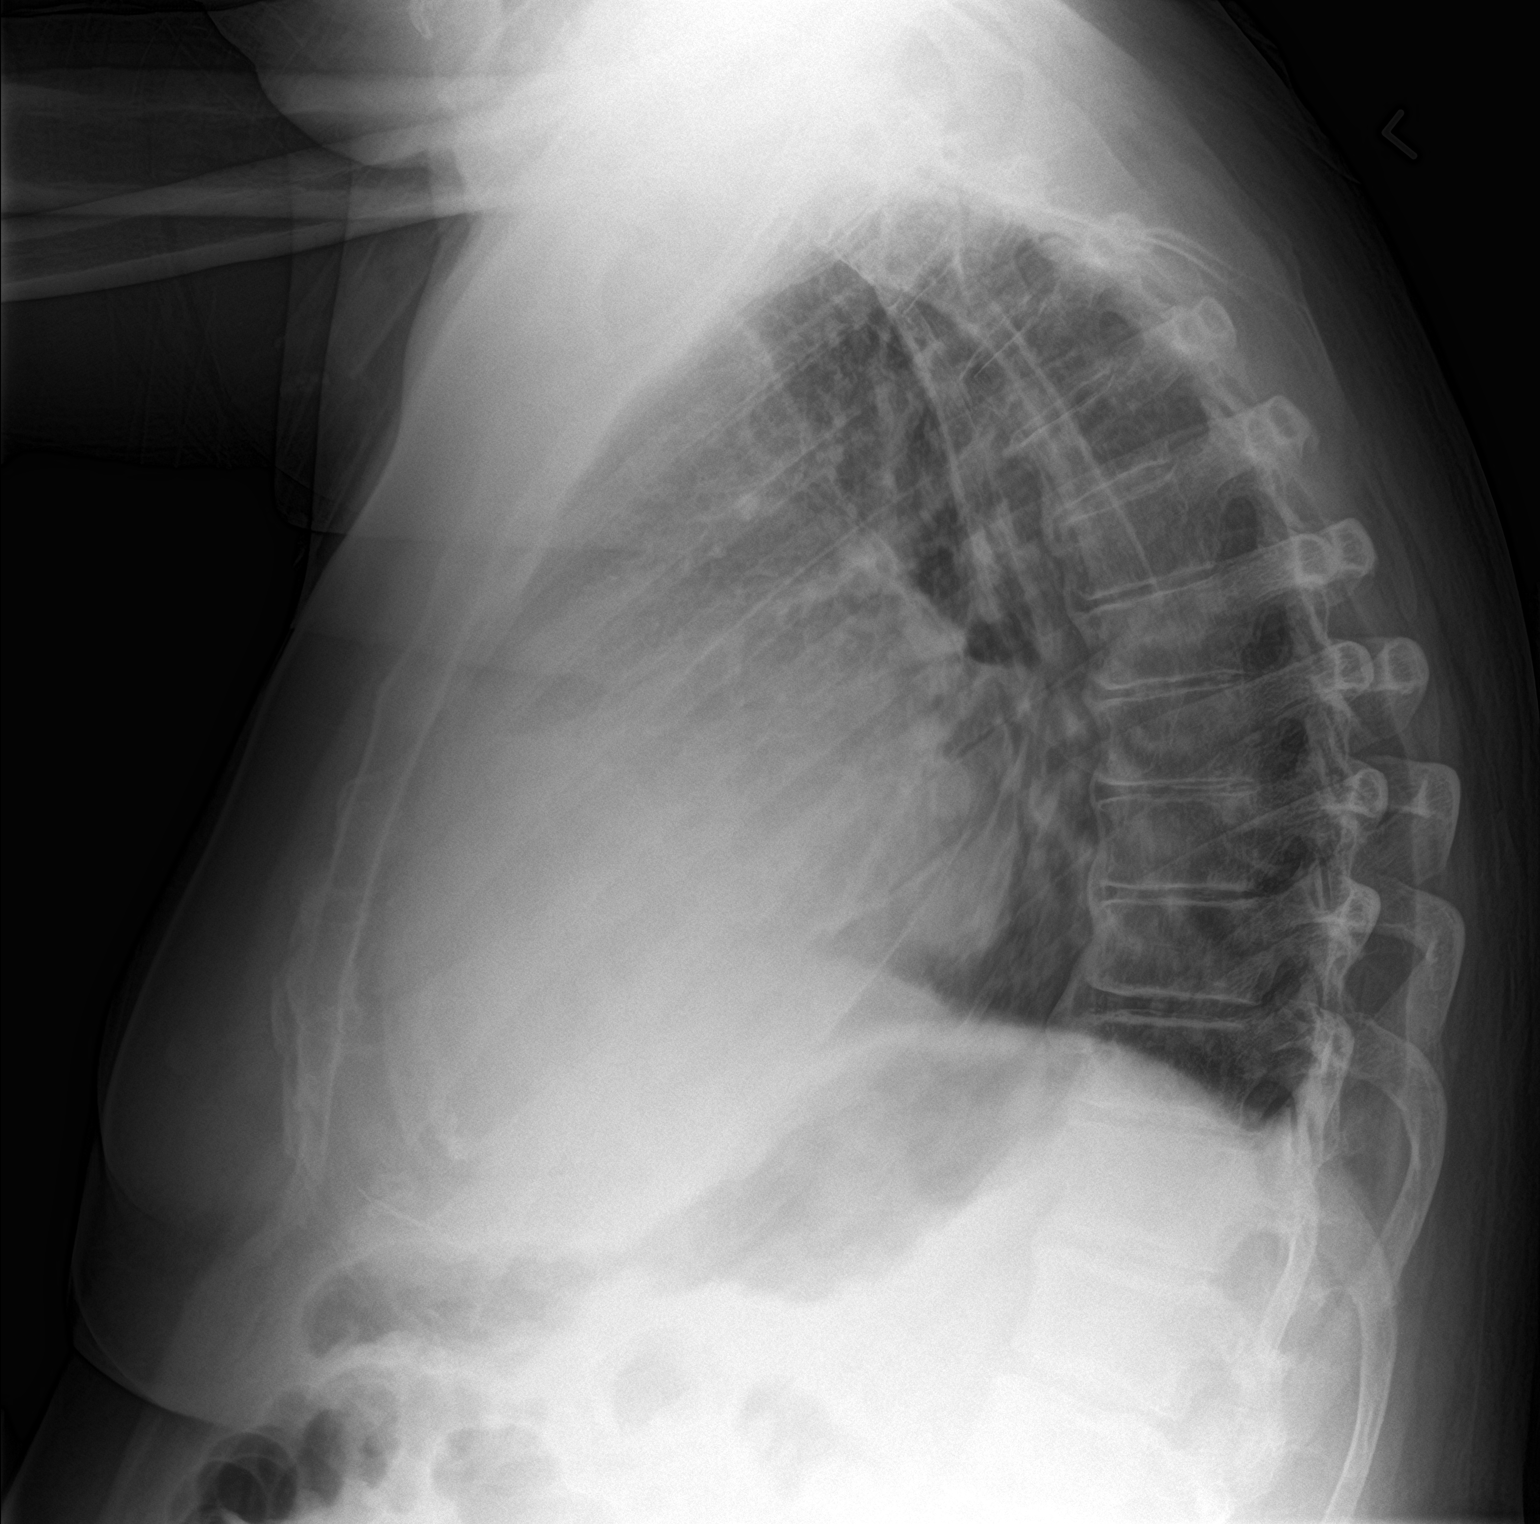

[2 of 2 positions shown; findings below may reference images not displayed]

FINDINGS: Cardiomegaly, vascular congestion. No confluent opacities or
effusions. No acute bony abnormality. No visible rib fracture or
pneumothorax.
IMPRESSION: Cardiomegaly, vascular congestion.

## 2022-03-16 ENCOUNTER — Observation Stay
Admission: EM | Admit: 2022-03-16 | Discharge: 2022-03-18 | Disposition: A | Discharge status: Home / Self Care | Payer: BC Managed Care – PPO | Attending: Internal Medicine | Admitting: Internal Medicine

## 2022-03-16 ENCOUNTER — Emergency Department: Payer: BC Managed Care – PPO

## 2022-03-16 ENCOUNTER — Encounter: Payer: Self-pay | Admitting: Internal Medicine

## 2022-03-16 ENCOUNTER — Other Ambulatory Visit: Payer: Self-pay

## 2022-03-16 DIAGNOSIS — I5022 Chronic systolic (congestive) heart failure: Secondary | ICD-10-CM | POA: Diagnosis not present

## 2022-03-16 DIAGNOSIS — R7309 Other abnormal glucose: Secondary | ICD-10-CM | POA: Diagnosis not present

## 2022-03-16 DIAGNOSIS — E669 Obesity, unspecified: Secondary | ICD-10-CM | POA: Diagnosis present

## 2022-03-16 DIAGNOSIS — I10 Essential (primary) hypertension: Secondary | ICD-10-CM | POA: Diagnosis not present

## 2022-03-16 DIAGNOSIS — I160 Hypertensive urgency: Secondary | ICD-10-CM | POA: Diagnosis not present

## 2022-03-16 DIAGNOSIS — I130 Hypertensive heart and chronic kidney disease with heart failure and stage 1 through stage 4 chronic kidney disease, or unspecified chronic kidney disease: Secondary | ICD-10-CM | POA: Insufficient documentation

## 2022-03-16 DIAGNOSIS — N1832 Chronic kidney disease, stage 3b: Secondary | ICD-10-CM | POA: Diagnosis not present

## 2022-03-16 DIAGNOSIS — I5042 Chronic combined systolic (congestive) and diastolic (congestive) heart failure: Secondary | ICD-10-CM | POA: Insufficient documentation

## 2022-03-16 DIAGNOSIS — K625 Hemorrhage of anus and rectum: Secondary | ICD-10-CM | POA: Diagnosis present

## 2022-03-16 DIAGNOSIS — N1831 Chronic kidney disease, stage 3a: Secondary | ICD-10-CM | POA: Diagnosis present

## 2022-03-16 DIAGNOSIS — K922 Gastrointestinal hemorrhage, unspecified: Secondary | ICD-10-CM | POA: Diagnosis not present

## 2022-03-16 HISTORY — DX: Chronic kidney disease, stage 3 unspecified: N18.30

## 2022-03-16 HISTORY — DX: Chronic systolic (congestive) heart failure: I50.22

## 2022-03-16 LAB — COMPREHENSIVE METABOLIC PANEL
ALT: 25 U/L (ref 0–44)
AST: 23 U/L (ref 15–41)
Albumin: 3.8 g/dL (ref 3.5–5.0)
Alkaline Phosphatase: 57 U/L (ref 38–126)
Anion gap: 11 (ref 5–15)
BUN: 32 mg/dL — ABNORMAL HIGH (ref 8–23)
CO2: 20 mmol/L — ABNORMAL LOW (ref 22–32)
Calcium: 8.7 mg/dL — ABNORMAL LOW (ref 8.9–10.3)
Chloride: 110 mmol/L (ref 98–111)
Creatinine, Ser: 1.84 mg/dL — ABNORMAL HIGH (ref 0.61–1.24)
GFR, Estimated: 41 mL/min — ABNORMAL LOW (ref >60)
Glucose, Bld: 105 mg/dL — ABNORMAL HIGH (ref 70–99)
Potassium: 3.8 mmol/L (ref 3.5–5.1)
Sodium: 141 mmol/L (ref 135–145)
Total Bilirubin: 0.9 mg/dL (ref 0.3–1.2)
Total Protein: 7.4 g/dL (ref 6.5–8.1)

## 2022-03-16 LAB — CBC
HCT: 40.6 % (ref 39.0–52.0)
HCT: 39.7 % (ref 39.0–52.0)
Hemoglobin: 13.3 g/dL (ref 13.0–17.0)
Hemoglobin: 12.8 g/dL — ABNORMAL LOW (ref 13.0–17.0)
MCH: 29.2 pg (ref 26.0–34.0)
MCH: 28.9 pg (ref 26.0–34.0)
MCHC: 32.8 g/dL (ref 30.0–36.0)
MCHC: 32.2 g/dL (ref 30.0–36.0)
MCV: 89 fL (ref 80.0–100.0)
MCV: 89.6 fL (ref 80.0–100.0)
Platelets: 228 10*3/uL (ref 150–400)
Platelets: 210 10*3/uL (ref 150–400)
RBC: 4.56 MIL/uL (ref 4.22–5.81)
RBC: 4.43 MIL/uL (ref 4.22–5.81)
RDW: 12.6 % (ref 11.5–15.5)
RDW: 12.7 % (ref 11.5–15.5)
WBC: 9.5 10*3/uL (ref 4.0–10.5)
WBC: 8 10*3/uL (ref 4.0–10.5)
nRBC: 0 % (ref 0.0–0.2)
nRBC: 0 % (ref 0.0–0.2)

## 2022-03-16 LAB — PROTIME-INR
INR: 1.1 (ref 0.8–1.2)
Prothrombin Time: 14.5 seconds (ref 11.4–15.2)

## 2022-03-16 LAB — HIV ANTIBODY (ROUTINE TESTING W REFLEX): HIV Screen 4th Generation wRfx: NONREACTIVE

## 2022-03-16 LAB — APTT: aPTT: 30 seconds (ref 24–36)

## 2022-03-16 LAB — BRAIN NATRIURETIC PEPTIDE: B Natriuretic Peptide: 715.3 pg/mL — ABNORMAL HIGH (ref 0.0–100.0)

## 2022-03-16 MED ORDER — AMLODIPINE BESYLATE 5 MG PO TABS
5.0000 mg | ORAL_TABLET | Freq: Every day | ORAL | Status: DC
  Administered 2022-03-16: 5 mg via ORAL
  Filled 2022-03-16: qty 1

## 2022-03-16 MED ORDER — SODIUM CHLORIDE 0.9 % IV SOLN: INTRAVENOUS | Status: DC

## 2022-03-16 MED ORDER — AMLODIPINE BESYLATE 5 MG PO TABS
5.0000 mg | ORAL_TABLET | Freq: Once | ORAL | Status: AC
  Administered 2022-03-16: 5 mg via ORAL
  Filled 2022-03-16: qty 1

## 2022-03-16 MED ORDER — PANTOPRAZOLE SODIUM 40 MG IV SOLR
40.0000 mg | Freq: Two times a day (BID) | INTRAVENOUS | Status: DC
  Administered 2022-03-16 – 2022-03-18 (×4): 40 mg via INTRAVENOUS
  Filled 2022-03-16 (×4): qty 10

## 2022-03-16 MED ORDER — ACETAMINOPHEN 325 MG PO TABS: 650.0000 mg | ORAL_TABLET | Freq: Four times a day (QID) | ORAL | Status: DC | PRN

## 2022-03-16 MED ORDER — IOHEXOL 350 MG/ML SOLN
100.0000 mL | Freq: Once | INTRAVENOUS | Status: AC | PRN
  Administered 2022-03-16: 100 mL via INTRAVENOUS

## 2022-03-16 MED ORDER — AMLODIPINE BESYLATE 10 MG PO TABS
10.0000 mg | ORAL_TABLET | Freq: Every day | ORAL | Status: DC
Start: 2022-03-17 — End: 2022-03-17

## 2022-03-16 MED ORDER — PEG 3350-KCL-NA BICARB-NACL 420 G PO SOLR
4000.0000 mL | Freq: Once | ORAL | Status: AC
Start: 2022-03-16 — End: 2022-03-16
  Administered 2022-03-16: 4000 mL via ORAL
  Filled 2022-03-16: qty 4000

## 2022-03-16 MED ORDER — HYDRALAZINE HCL 20 MG/ML IJ SOLN
5.0000 mg | INTRAMUSCULAR | Status: DC | PRN
  Administered 2022-03-16 – 2022-03-17 (×5): 5 mg via INTRAVENOUS
  Filled 2022-03-16 (×5): qty 1

## 2022-03-16 MED ORDER — ONDANSETRON HCL 4 MG/2ML IJ SOLN: 4.0000 mg | Freq: Three times a day (TID) | INTRAMUSCULAR | Status: DC | PRN

## 2022-03-16 MED ORDER — SODIUM CHLORIDE 0.9 % IV BOLUS
1000.0000 mL | Freq: Once | INTRAVENOUS | Status: DC
  Administered 2022-03-16: 1000 mL via INTRAVENOUS

## 2022-03-16 NOTE — ED Notes (Addendum)
Pt presents to ED and reports while he was at work today he had an episode of a normal BM but noticed bright red blood when he wiped. Pt denies blood thinner use. Pt states this has never happened before. Pt denies any insertion of any object in his rectum. Pt denies ABD pain. Pt denies dizziness or weakness. Pt is A&O4.  ? ?Pt does presents hypertensive but states he does not take any medications at this time due to not having as PCP. Pt denies any headache at this time. NAD noted.  ?

## 2022-03-16 NOTE — ED Triage Notes (Signed)
Pt here with a rectal bleed this morning. Pt states blood was bright red, pt denies pain. Pt denies blood thinner, N/V.  ?
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
## 2022-03-16 NOTE — ED Notes (Signed)
Informed RN bed assigned 

## 2022-03-16 NOTE — ED Provider Notes (Signed)
? ?[facility] ?Provider Note ? ? ? Event Date/Time  ? First MD Initiated Contact with Patient 03/16/22 1016   ?  (approximate) ? ?History  ? ?Chief Complaint: Rectal Bleeding ? ?HPI ? ?Tristan Villegas is a 61 y.o. male with a past medical history of hypertension, currently not taking any blood pressure medications, presents to the emergency department for rectal bleeding.  According to the patient he woke up this morning had a bowel movement that contained some stool but mostly blood.  Patient states since that time he has had 2 or 3 additional bowel movements that have been all bright red blood.  No history of GI bleed previously.  Denies any use of anticoagulation.  Denies any abdominal pain. ? ?Physical Exam  ? ?Triage Vital Signs: ?ED Triage Vitals [03/16/22 1004]  ?Enc Vitals Group  ?   BP (!) 217/128  ?   Pulse Rate 92  ?   Resp 18  ?   Temp 99.1 ?F (37.3 ?C)  ?   Temp Source Oral  ?   SpO2 96 %  ?   Weight 232 lb (105.2 kg)  ?   Height 6' (1.829 m)  ?   Head Circumference   ?   Peak Flow   ?   Pain Score 0  ?   Pain Loc   ?   Pain Edu?   ?   Excl. in [place]?   ? ? ?Most recent vital signs: ?Vitals:  ? 03/16/22 1004  ?BP: (!) 217/128  ?Pulse: 92  ?Resp: 18  ?Temp: 99.1 ?F (37.3 ?C)  ?SpO2: 96%  ? ? ?General: Awake, no distress.  ?CV:  Good peripheral perfusion.  Regular rate and rhythm  ?Resp:  Normal effort.  Equal breath sounds bilaterally. ?Abd:  No distention.  Soft, nontender.  No rebound or guarding. ?Other:  Rectal examination shows gross hematochezia. ? ? ?ED Results / Procedures / Treatments  ? ?EKG ? ?EKG viewed and interpreted by myself shows a normal sinus rhythm at 84 bpm with a narrow QRS, normal axis, normal intervals, no concerning ST changes. ? ?RADIOLOGY ? ?I personally reviewed the CT images no significant abnormality seen on my evaluation. ?Radiology is read the CT is negative for acute extravasation ? ? ?MEDICATIONS ORDERED IN ED: ?Medications - No data to  display ? ? ?IMPRESSION / MDM / ASSESSMENT AND PLAN / ED COURSE  ?I reviewed the triage vital signs and the nursing notes. ? ?Patient presents to the emergency department for rectal bleeding.  Patient states rectal bleeding started this morning he has had 3 possibly 4 bloody bowel movements now all bright red blood.  Denies any dark stool.  Denies any nausea or vomiting.  Denies any abdominal pain.  No history of prior GI bleed, no anticoagulation.  Patient states he had a colonoscopy at age 42 (7 years ago) with no significant findings.  Overall patient appears well, benign abdomen.  Given the patient's hematochezia we will check labs and obtain a CT angio GI bleed protocol.  We will continue to closely monitor while awaiting results.  Given the amount of rectal bleeding patient will require admission to the hospital service once his emergency department work-up is been completed. ? ?Patient's labs show normal H&H.  Chemistry does show chronic kidney disease however largely unchanged from prior.  CT scan does not show any acute extravasation.  However given the patient's degree of hematochezia on exam we will admit to the hospitalist  service for further treatment monitoring and possible GI consultation. ? ?FINAL CLINICAL IMPRESSION(S) / ED DIAGNOSES  ? ?GI bleed ? ? ? ?Note:  This document was prepared using Dragon voice recognition software and may include unintentional dictation errors. ?  ?Harvest Dark, MD ?03/16/22 1146 ? ?

## 2022-03-16 NOTE — ED Notes (Signed)
Pt at CT
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
## 2022-03-16 NOTE — Progress Notes (Signed)
Admission profile updated. ?

## 2022-03-17 ENCOUNTER — Encounter: Payer: Self-pay | Admitting: Anesthesiology

## 2022-03-17 ENCOUNTER — Encounter
Admission: EM | Disposition: A | Discharge status: Home / Self Care | Payer: Self-pay | Source: Home / Self Care | Attending: Emergency Medicine

## 2022-03-17 ENCOUNTER — Observation Stay
Admit: 2022-03-17 | Discharge: 2022-03-17 | Disposition: A | Discharge status: Home / Self Care | Payer: BC Managed Care – PPO | Attending: Internal Medicine | Admitting: Internal Medicine

## 2022-03-17 DIAGNOSIS — K625 Hemorrhage of anus and rectum: Secondary | ICD-10-CM | POA: Diagnosis not present

## 2022-03-17 DIAGNOSIS — E669 Obesity, unspecified: Secondary | ICD-10-CM

## 2022-03-17 DIAGNOSIS — I160 Hypertensive urgency: Secondary | ICD-10-CM | POA: Diagnosis not present

## 2022-03-17 DIAGNOSIS — I5022 Chronic systolic (congestive) heart failure: Secondary | ICD-10-CM | POA: Diagnosis not present

## 2022-03-17 DIAGNOSIS — N1831 Chronic kidney disease, stage 3a: Secondary | ICD-10-CM

## 2022-03-17 LAB — CBC
HCT: 40.5 % (ref 39.0–52.0)
HCT: 41.5 % (ref 39.0–52.0)
HCT: 42.8 % (ref 39.0–52.0)
HCT: 42.9 % (ref 39.0–52.0)
HCT: 43.4 % (ref 39.0–52.0)
Hemoglobin: 13.2 g/dL (ref 13.0–17.0)
Hemoglobin: 13.6 g/dL (ref 13.0–17.0)
Hemoglobin: 13.8 g/dL (ref 13.0–17.0)
Hemoglobin: 13.9 g/dL (ref 13.0–17.0)
Hemoglobin: 14.1 g/dL (ref 13.0–17.0)
MCH: 28.8 pg (ref 26.0–34.0)
MCH: 28.9 pg (ref 26.0–34.0)
MCH: 28.9 pg (ref 26.0–34.0)
MCH: 28.9 pg (ref 26.0–34.0)
MCH: 29.1 pg (ref 26.0–34.0)
MCHC: 32.2 g/dL (ref 30.0–36.0)
MCHC: 32.4 g/dL (ref 30.0–36.0)
MCHC: 32.5 g/dL (ref 30.0–36.0)
MCHC: 32.6 g/dL (ref 30.0–36.0)
MCHC: 32.8 g/dL (ref 30.0–36.0)
MCV: 88.3 fL (ref 80.0–100.0)
MCV: 88.6 fL (ref 80.0–100.0)
MCV: 88.8 fL (ref 80.0–100.0)
MCV: 89.2 fL (ref 80.0–100.0)
MCV: 90.1 fL (ref 80.0–100.0)
Platelets: 235 10*3/uL (ref 150–400)
Platelets: 246 10*3/uL (ref 150–400)
Platelets: 248 10*3/uL (ref 150–400)
Platelets: 252 10*3/uL (ref 150–400)
Platelets: 256 10*3/uL (ref 150–400)
RBC: 4.56 MIL/uL (ref 4.22–5.81)
RBC: 4.7 MIL/uL (ref 4.22–5.81)
RBC: 4.75 MIL/uL (ref 4.22–5.81)
RBC: 4.81 MIL/uL (ref 4.22–5.81)
RBC: 4.9 MIL/uL (ref 4.22–5.81)
RDW: 12.7 % (ref 11.5–15.5)
RDW: 12.8 % (ref 11.5–15.5)
RDW: 12.8 % (ref 11.5–15.5)
RDW: 12.8 % (ref 11.5–15.5)
RDW: 12.9 % (ref 11.5–15.5)
WBC: 10.6 10*3/uL — ABNORMAL HIGH (ref 4.0–10.5)
WBC: 7.9 10*3/uL (ref 4.0–10.5)
WBC: 8.1 10*3/uL (ref 4.0–10.5)
WBC: 8.9 10*3/uL (ref 4.0–10.5)
WBC: 9.7 10*3/uL (ref 4.0–10.5)
nRBC: 0 % (ref 0.0–0.2)
nRBC: 0 % (ref 0.0–0.2)
nRBC: 0 % (ref 0.0–0.2)
nRBC: 0 % (ref 0.0–0.2)
nRBC: 0 % (ref 0.0–0.2)

## 2022-03-17 LAB — ECHOCARDIOGRAM COMPLETE
AR max vel: 2.57 cm2
AV Area VTI: 2.49 cm2
AV Area mean vel: 2.36 cm2
AV Mean grad: 6 mmHg
AV Peak grad: 10.9 mmHg
Ao pk vel: 1.65 m/s
Area-P 1/2: 3.74 cm2
Height: 72 in
MV VTI: 1.9 cm2
S' Lateral: 5.34 cm
Single Plane A4C EF: 38.3 %
Weight: 3771.2 oz

## 2022-03-17 LAB — BASIC METABOLIC PANEL
Anion gap: 9 (ref 5–15)
BUN: 18 mg/dL (ref 8–23)
CO2: 20 mmol/L — ABNORMAL LOW (ref 22–32)
Calcium: 8.6 mg/dL — ABNORMAL LOW (ref 8.9–10.3)
Chloride: 109 mmol/L (ref 98–111)
Creatinine, Ser: 1.5 mg/dL — ABNORMAL HIGH (ref 0.61–1.24)
GFR, Estimated: 52 mL/min — ABNORMAL LOW (ref >60)
Glucose, Bld: 114 mg/dL — ABNORMAL HIGH (ref 70–99)
Potassium: 3.9 mmol/L (ref 3.5–5.1)
Sodium: 138 mmol/L (ref 135–145)

## 2022-03-17 LAB — GLUCOSE, CAPILLARY: Glucose-Capillary: 84 mg/dL (ref 70–99)

## 2022-03-17 SURGERY — COLONOSCOPY WITH PROPOFOL
Anesthesia: General

## 2022-03-17 MED ORDER — LIDOCAINE HCL (PF) 2 % IJ SOLN
INTRAMUSCULAR | Status: AC
  Filled 2022-03-17: qty 5

## 2022-03-17 MED ORDER — DEXAMETHASONE SODIUM PHOSPHATE 10 MG/ML IJ SOLN
INTRAMUSCULAR | Status: AC
  Filled 2022-03-17: qty 1

## 2022-03-17 MED ORDER — PROPOFOL 500 MG/50ML IV EMUL
INTRAVENOUS | Status: AC
  Filled 2022-03-17: qty 100

## 2022-03-17 MED ORDER — HYDRALAZINE HCL 25 MG PO TABS
25.0000 mg | ORAL_TABLET | Freq: Three times a day (TID) | ORAL | Status: DC
  Administered 2022-03-17 – 2022-03-18 (×2): 25 mg via ORAL
  Filled 2022-03-17 (×3): qty 1

## 2022-03-17 MED ORDER — SODIUM CHLORIDE 0.9 % IV SOLN: INTRAVENOUS | Status: DC

## 2022-03-17 MED ORDER — PERFLUTREN LIPID MICROSPHERE
1.0000 mL | INTRAVENOUS | Status: AC | PRN
  Administered 2022-03-17: 2 mL via INTRAVENOUS
  Filled 2022-03-17: qty 10

## 2022-03-17 MED ORDER — SUCCINYLCHOLINE CHLORIDE 200 MG/10ML IV SOSY
PREFILLED_SYRINGE | INTRAVENOUS | Status: AC
  Filled 2022-03-17: qty 10

## 2022-03-17 MED ORDER — SACUBITRIL-VALSARTAN 24-26 MG PO TABS
1.0000 | ORAL_TABLET | Freq: Two times a day (BID) | ORAL | Status: DC
  Administered 2022-03-17 – 2022-03-18 (×3): 1 via ORAL
  Filled 2022-03-17 (×3): qty 1

## 2022-03-17 MED ORDER — TRAMADOL HCL 50 MG PO TABS: 50.0000 mg | ORAL_TABLET | Freq: Four times a day (QID) | ORAL | Status: DC | PRN

## 2022-03-17 MED ORDER — FUROSEMIDE 10 MG/ML IJ SOLN
20.0000 mg | Freq: Every day | INTRAMUSCULAR | Status: DC
  Administered 2022-03-17 – 2022-03-18 (×2): 20 mg via INTRAVENOUS
  Filled 2022-03-17 (×2): qty 4

## 2022-03-17 MED ORDER — ONDANSETRON HCL 4 MG/2ML IJ SOLN
INTRAMUSCULAR | Status: AC
  Filled 2022-03-17: qty 2

## 2022-03-17 NOTE — Consult Note (Signed)
? ? ?Tristan Villegas is a 61 y.o. male  [id number] ? ?Primary physician, Khloei Spiker ?Reason for Consultation: CHF ? ?HPI: This is a 61 year old white male with a past medical history of chronic kidney disease hypertension and CHF with HFrEF ejection fraction 25% presented to the hospital with shortness of breath and rectal bleed.   ? ? ?Review of Systems: Denies any chest pain or shortness of breath ? ? ?Past Medical History:  ?Diagnosis Date  ? Chronic systolic CHF (congestive heart failure) ([place])   ? CKD (chronic kidney disease), stage III ([place])   ? Hypertension   ? ? ?Medications Prior to Admission  ?Medication Sig Dispense Refill  ? diphenhydrAMINE (BENADRYL) 25 MG tablet Take 25 mg by mouth daily.    ? ? ? ? furosemide  20 mg Intravenous Daily  ? hydrALAZINE  25 mg Oral Q8H  ? pantoprazole (PROTONIX) IV  40 mg Intravenous Q12H  ? sacubitril-valsartan  1 tablet Oral BID  ? ? ?Infusions: ? ? ?No Known Allergies ? ?Social History  ? ?Socioeconomic History  ? Marital status: Single  ?  Spouse name: Not on file  ? Number of children: Not on file  ? Years of education: Not on file  ? Highest education level: Not on file  ?Occupational History  ? Not on file  ?Tobacco Use  ? Smoking status: Never  ? Smokeless tobacco: Never  ?Vaping Use  ? Vaping Use: Never used  ?Substance and Sexual Activity  ? Alcohol use: Not Currently  ? Drug use: Not Currently  ? Sexual activity: Yes  ?  Partners: Female  ?Other Topics Concern  ? Not on file  ?Social History Narrative  ? Medical sales representative at [employer]  ? ?Social Determinants of Health  ? ?Financial Resource Strain: Not on file  ?Food Insecurity: Not on file  ?Transportation Needs: Not on file  ?Physical Activity: Not on file  ?Stress: Not on file  ?Social Connections: Not on file  ?Intimate Partner Violence: Not on file  ? ? ?Family History  ?Problem Relation Age of Onset  ? Hypertension Brother   ? ? ?PHYSICAL EXAM: ?Vitals:  ? 03/17/22 1215 03/17/22 1517  ?BP: (!) 180/100 (!)  174/92  ?Pulse: 72 77  ?Resp: 20 18  ?Temp: (!) 97 ?F (36.1 ?C) 98.2 ?F (36.8 ?C)  ?SpO2: 100% 97%  ? ? ? ?Intake/Output Summary (Last 24 hours) at 03/17/2022 1728 ?Last data filed at 03/17/2022 0358 ?Gross per 24 hour  ?Intake 556.74 ml  ?Output 3 ml  ?Net 553.74 ml  ? ? ?General:  Well appearing. No respiratory difficulty ?HEENT: normal ?Neck: supple. no JVD. Carotids 2+ bilat; no bruits. No lymphadenopathy or thryomegaly appreciated. ?Cor: PMI nondisplaced. Regular rate & rhythm. No rubs, gallops or murmurs. ?Lungs: clear ?Abdomen: soft, nontender, nondistended. No hepatosplenomegaly. No bruits or masses. Good bowel sounds. ?Extremities: no cyanosis, clubbing, rash, edema ?Neuro: alert & oriented x 3, cranial nerves grossly intact. moves all 4 extremities w/o difficulty. Affect pleasant. ? ?ECG: Normal sinus rhythm LVH ? ?Results for orders placed or performed during the hospital encounter of 03/16/22 (from the past 24 hour(s))  ?CBC     Status: Abnormal  ? Collection Time: 03/16/22  5:38 PM  ?Result Value Ref Range  ? WBC 8.0 4.0 - 10.5 K/uL  ? RBC 4.43 4.22 - 5.81 MIL/uL  ? Hemoglobin 12.8 (L) 13.0 - 17.0 g/dL  ? HCT 39.7 39.0 - 52.0 %  ? MCV 89.6 80.0 -  100.0 fL  ? MCH 28.9 26.0 - 34.0 pg  ? MCHC 32.2 30.0 - 36.0 g/dL  ? RDW 12.7 11.5 - 15.5 %  ? Platelets 210 150 - 400 K/uL  ? nRBC 0.0 0.0 - 0.2 %  ?HIV Antibody (routine testing w rflx)     Status: None  ? Collection Time: 03/16/22  5:38 PM  ?Result Value Ref Range  ? HIV Screen 4th Generation wRfx Non Reactive Non Reactive  ?CBC     Status: None  ? Collection Time: 03/16/22 11:30 PM  ?Result Value Ref Range  ? WBC 9.7 4.0 - 10.5 K/uL  ? RBC 4.81 4.22 - 5.81 MIL/uL  ? Hemoglobin 13.9 13.0 - 17.0 g/dL  ? HCT 42.9 39.0 - 52.0 %  ? MCV 89.2 80.0 - 100.0 fL  ? MCH 28.9 26.0 - 34.0 pg  ? MCHC 32.4 30.0 - 36.0 g/dL  ? RDW 12.8 11.5 - 15.5 %  ? Platelets 235 150 - 400 K/uL  ? nRBC 0.0 0.0 - 0.2 %  ?Basic metabolic panel     Status: Abnormal  ? Collection Time: 03/17/22   6:54 AM  ?Result Value Ref Range  ? Sodium 138 135 - 145 mmol/L  ? Potassium 3.9 3.5 - 5.1 mmol/L  ? Chloride 109 98 - 111 mmol/L  ? CO2 20 (L) 22 - 32 mmol/L  ? Glucose, Bld 114 (H) 70 - 99 mg/dL  ? BUN 18 8 - 23 mg/dL  ? Creatinine, Ser 1.50 (H) 0.61 - 1.24 mg/dL  ? Calcium 8.6 (L) 8.9 - 10.3 mg/dL  ? GFR, Estimated 52 (L) >60 mL/min  ? Anion gap 9 5 - 15  ?CBC     Status: None  ? Collection Time: 03/17/22  6:54 AM  ?Result Value Ref Range  ? WBC 7.9 4.0 - 10.5 K/uL  ? RBC 4.70 4.22 - 5.81 MIL/uL  ? Hemoglobin 13.6 13.0 - 17.0 g/dL  ? HCT 41.5 39.0 - 52.0 %  ? MCV 88.3 80.0 - 100.0 fL  ? MCH 28.9 26.0 - 34.0 pg  ? MCHC 32.8 30.0 - 36.0 g/dL  ? RDW 12.7 11.5 - 15.5 %  ? Platelets 246 150 - 400 K/uL  ? nRBC 0.0 0.0 - 0.2 %  ?Glucose, capillary     Status: None  ? Collection Time: 03/17/22 10:41 AM  ?Result Value Ref Range  ? Glucose-Capillary 84 70 - 99 mg/dL  ?CBC     Status: None  ? Collection Time: 03/17/22  1:29 PM  ?Result Value Ref Range  ? WBC 8.9 4.0 - 10.5 K/uL  ? RBC 4.90 4.22 - 5.81 MIL/uL  ? Hemoglobin 14.1 13.0 - 17.0 g/dL  ? HCT 43.4 39.0 - 52.0 %  ? MCV 88.6 80.0 - 100.0 fL  ? MCH 28.8 26.0 - 34.0 pg  ? MCHC 32.5 30.0 - 36.0 g/dL  ? RDW 12.9 11.5 - 15.5 %  ? Platelets 256 150 - 400 K/uL  ? nRBC 0.0 0.0 - 0.2 %  ? ?CT ANGIO GI BLEED ? ?Result Date: 03/16/2022 ?CLINICAL DATA:  Rectal bleeding, no pain EXAM: CTA ABDOMEN AND PELVIS WITHOUT AND WITH CONTRAST TECHNIQUE: Multidetector CT imaging of the abdomen and pelvis was performed using the standard protocol during bolus administration of intravenous contrast. Multiplanar reconstructed images and MIPs were obtained and reviewed to evaluate the vascular anatomy. RADIATION DOSE REDUCTION: This exam was performed according to the departmental dose-optimization program which includes automated exposure control, adjustment  of the mA and/or kV according to patient size and/or use of iterative reconstruction technique. CONTRAST:  158mL OMNIPAQUE IOHEXOL 350 MG/ML  SOLN COMPARISON:  None. FINDINGS: VASCULAR Normal contour and caliber of the abdominal aorta. No evidence of aneurysm, dissection, or other acute aortic pathology. Standard branching pattern of the abdominal aorta, with solitary bilateral renal arteries. The branch vessel ostia are patent. Mild mixed calcific atherosclerosis. Review of the MIP images confirms the above findings. NON-VASCULAR Lower chest: No acute abnormality. Cardiomegaly. Coronary artery calcifications. Hepatobiliary: No solid liver abnormality is seen. No gallstones, gallbladder wall thickening, or biliary dilatation. Pancreas: Unremarkable. No pancreatic ductal dilatation or surrounding inflammatory changes. Spleen: Normal in size without significant abnormality. Adrenals/Urinary Tract: Adrenal glands are unremarkable. Punctuate bilateral nonobstructive renal calculi of the inferior poles. No ureteral calculi or hydronephrosis. Bladder is unremarkable. Stomach/Bowel: Stomach is within normal limits. Appendix appears normal. No evidence of bowel wall thickening, distention, or inflammatory changes. Pancolonic diverticulosis. Fluid-filled rectum. No intraluminal contrast extravasation on multiphasic imaging to localize GI bleeding. Lymphatic: No enlarged abdominal or pelvic lymph nodes. Reproductive: No mass or other significant abnormality. Other: No abdominal wall hernia or abnormality. No ascites. Musculoskeletal: No acute or significant osseous findings. IMPRESSION: 1. No intraluminal contrast extravasation on multiphasic imaging to localize GI bleeding. 2. Pancolonic diverticulosis without evidence of acute diverticulitis. 3. Normal contour and caliber of the abdominal aorta. No acute pathology. Mild atherosclerosis 4. Punctuate bilateral nonobstructive renal calculi. 5. Cardiomegaly and coronary artery disease. Aortic Atherosclerosis (ICD10-I70.0). Electronically Signed   By: Delanna Ahmadi M.D.   On: 03/16/2022 11:28   ? ? ?ASSESSMENT AND  PLAN: Congestive heart failure with HFrEF and severe LV dysfunction.  Patient also having rectal bleed and needs endoscopy since the procedure is low risk advise proceeding with endoscopy. ? ?Zuriel Yeaman

## 2022-03-17 NOTE — Assessment & Plan Note (Addendum)
Patient left AMA at previous hospitalization when he was first diagnosed with congestive heart failure in November 2020.  Patient states he has not ever seen a cardiologist.  He states that he has not been on any blood pressure medications for over a year and a half since his PCP retired.  Started on Entresto and Lasix.  Seen by cardiology during this admission.  Repeat echocardiogram actually noted improvement in ejection fraction up to 30%, but now he is also developed grade 2 diastolic dysfunction. ? ?Patient denies any heavy alcohol use ever.  He used to occasionally have a drink, but has not drank in many years.  Denies any drug use.  No family history of any heart disease.  He does not recall any episodes of chest pain.  He states that at his baseline, he walks daily with his wife and does not have dyspnea on exertion or shortness of breath. ? ?Patient will follow-up with cardiology next week as outpatient.  In the meantime, will start on hydralazine and Entresto.  With 1 dose of IV Lasix, creatinine went from 1.5 to 1.7.  Appears to be euvolemic ?

## 2022-03-17 NOTE — Progress Notes (Signed)
[facility] Progress Note ? ?PatientBurchell Villegas    [id number]  DOA: 03/16/2022    ?Date of Service: the patient was seen and examined on 03/17/2022 ? ?Brief hospital course: ?Patient is a 61 year old male with past medical history of hypertension, stage IIIa chronic kidney disease and advanced chronic systolic heart failure with ejection fraction 20%.  This was diagnosed back in November 2020 and at that time, patient left AMA prior to being seen by cardiology.  He states that he has not been on any blood pressure medications since his PCP retired.  Unclear if he has seen a cardiologist or has undergone a cardiology work-up.  He presents to the emergency room on 4/6 with episodes of rectal bleeding.  In emergency room, hemoglobin stable creatinine of 1.84.  BNP of 715.  Brought into the hospitalist service and GI consulted.  They are taking him for colonoscopy today. ? ?Assessment and Plan: ?Assessment and Plan: ?* Rectal bleeding ?Hemoglobin stable.  Suspect diverticular bleed.  For colonoscopy today. ? ?Chronic systolic CHF (congestive heart failure) ([place]) ?Patient left AMA at previous hospitalization when he was first diagnosed with congestive heart failure in November 2020.  Patient states he has not ever seen a cardiologist.  He states that he has not been on any blood pressure medications for over a year and a half since his PCP retired.  We will start Lasix and Entresto, have cardiology see him and check a repeat echo. ? ?Patient denies any heavy alcohol use ever.  He used to occasionally have a drink, but has not drank in many years.  Denies any drug use.  No family history of any heart disease.  He does not recall any episodes of chest pain.  He states that at his baseline, he walks daily with his wife and does not have dyspnea on exertion or shortness of breath. ? ?Hypertensive urgency ?Reportedly, patient has not been on any blood pressure medication since his PCP retired.  Review of his  medical record, patient was seen in November 2020 and at that time, had not been taking blood pressure medications for several months because of insurance issues.  Patient's PCP retired over a year and a half ago and patient says he has not been on any blood pressure medicine since.  Cardiology consult.  In the meantime, given his ejection fraction, mildly elevated BNP and uncontrolled medications, will start with Lasix and Entresto. ? ?Chronic kidney disease, stage 3a ([place]) ?At baseline.  Renal function similar to November 2020.  Creatinine today at 1.5 with GFR 52. ? ?Obesity (BMI 30-39.9) ?Patient meets criteria with BMI greater than 30 ? ? ? ? ? ? ?Body mass index is 31.97 kg/m?.  ?  ?   ? ?Consultants: ?Cardiology ?Gastroenterology ? ?Procedures: ?2D echo pending ?Colonoscopy 4/7 ? ?Antimicrobials: ?None ? ?Code Status: Full code ? ? ?Subjective: Patient doing okay.  Denies any chest pain or shortness of breath ? ?Objective: ?Noted elevated blood pressures ?Vitals:  ? 03/17/22 [id number] 03/17/22 1018  ?BP: (!) 180/102 (!) 172/90  ?Pulse: 75 72  ?Resp:  16  ?Temp:  98.2 ?F (36.8 ?C)  ?SpO2:  100%  ? ? ?Intake/Output Summary (Last 24 hours) at 03/17/2022 1152 ?Last data filed at 03/17/2022 0358 ?Gross per 24 hour  ?Intake 556.74 ml  ?Output 3 ml  ?Net 553.74 ml  ? ?Filed Weights  ? 03/16/22 1004 03/17/22 0421  ?Weight: 105.2 kg 106.9 kg  ? ?Body mass index is 31.97 kg/m?Marland Kitchen ? ?  Exam: ? ?General: Alert and oriented x3, no acute distress ?HEENT: Normocephalic, atraumatic, mucous membranes are moist ?Cardiovascular: Regular rate and rhythm, S1-S2, 2 out of 6 systolic ejection murmur ?Respiratory: Decreased breath sounds bibasilar ?Abdomen: Soft, nontender, nondistended, positive bowel sounds ?Musculoskeletal: No clubbing or cyanosis or edema ?Skin: No skin breaks, tears or lesions ?Psychiatry: Appropriate, no evidence of psychoses ?Neurology: No focal deficits ? ?Data Reviewed: ?Creatinine today improved to 1.5, patient's  baseline.  Hemoglobin stable unchanged. ? ?Disposition:  ?Status is: Observation ? ?  ? ?Anticipated discharge date: 4/7 or maybe longer, depending on cardiology plans for work-up ? ?Remaining issues to be resolved so that patient can be discharged: Results of colonoscopy, 2D echo and cardiology plans ? ? ?Family Communication: Left message for wife ?DVT Prophylaxis: ?SCDs Start: 03/16/22 1152 ? ? ? ?Author: ?Annita Brod ,MD ?03/17/2022 11:52 AM ? ?To reach On-call, see care teams to locate the attending and reach out via [email or web address]. ?Between 7PM-7AM, please contact night-coverage ?If you still have difficulty reaching the attending provider, please page the [facility] (Director on Call) for [facility] on amion for assistance. ? ?

## 2022-03-17 NOTE — Progress Notes (Signed)
*  PRELIMINARY RESULTS* ?Echocardiogram ?2D Echocardiogram has been performed. ? ?Tristan Villegas Tristan Villegas ?03/17/2022, 11:54 AM ?

## 2022-03-17 NOTE — Assessment & Plan Note (Addendum)
At baseline.  Renal function similar to November 2020.  Creatinine on day of discharge at 1.7 with GFR 45. ?

## 2022-03-17 NOTE — Assessment & Plan Note (Signed)
Patient meets criteria with BMI greater than 30 

## 2022-03-17 NOTE — Hospital Course (Addendum)
Patient is a 61 year old male with past medical history of hypertension, stage IIIa chronic kidney disease and advanced chronic systolic heart failure with ejection fraction 20%.  This was diagnosed back in November 2020 and at that time, patient left AMA prior to being seen by cardiology.  He states that he has not been on any blood pressure medications since his PCP retired.  Unclear if he has seen a cardiologist or has undergone a cardiology work-up.  He presents to the emergency room on 4/6 with episodes of rectal bleeding.  In emergency room, hemoglobin stable creatinine of 1.84.  BNP of 715.  Brought into the hospitalist service and GI consulted.   ? ?Initial plan was to take patient with colonoscopy on 4/7, however colonoscopy canceled due to anesthesia concern for ejection fraction.  Patient cleared by cardiology after, however unable to do until Monday 4/10.  Repeat echocardiogram noted improvement in ejection fraction of to 30% although has also developed diastolic dysfunction. ?

## 2022-03-17 NOTE — OR Nursing (Signed)
Pt with  EF 20. NOT SEEN BY CARDIOLOGY. PROCEDURE CANCELLED. REPORT TO SAM,RN ?

## 2022-03-17 NOTE — Assessment & Plan Note (Addendum)
Hemoglobin stable.  Suspect diverticular bleed.  Unable to do colonoscopy on 4/7.  Patient declined waiting until 4/10.  Outpatient referral to GI.  Hemoglobin stable, at 14.2 by day of discharge ?

## 2022-03-17 NOTE — Assessment & Plan Note (Addendum)
Reportedly, patient has not been on any blood pressure medication since his PCP retired.  Review of his medical record, patient was seen in November 2020 and at that time, had not been taking blood pressure medications for several months because of insurance issues.  Patient's PCP retired over a year and a half ago and patient says he has not been on any blood pressure medicine since.  Cardiology consult.  In the meantime, given his ejection fraction, mildly elevated BNP and uncontrolled medications, started on Entresto and Lasix. ? ?By following day, blood pressure still elevated and also added with hydralazine.  Will discharge on Entresto and hydralazine.  She will see cardiology next week and at that time medications can be adjusted or changed.  Held off on beta-blocker due to existing heart rate in the high 60s to low 70s at baseline. ?

## 2022-03-18 DIAGNOSIS — I5042 Chronic combined systolic (congestive) and diastolic (congestive) heart failure: Secondary | ICD-10-CM | POA: Diagnosis not present

## 2022-03-18 DIAGNOSIS — K625 Hemorrhage of anus and rectum: Secondary | ICD-10-CM | POA: Diagnosis not present

## 2022-03-18 DIAGNOSIS — N1831 Chronic kidney disease, stage 3a: Secondary | ICD-10-CM | POA: Diagnosis not present

## 2022-03-18 DIAGNOSIS — I160 Hypertensive urgency: Secondary | ICD-10-CM | POA: Diagnosis not present

## 2022-03-18 LAB — CBC
HCT: 43.4 % (ref 39.0–52.0)
Hemoglobin: 14.2 g/dL (ref 13.0–17.0)
MCH: 28.7 pg (ref 26.0–34.0)
MCHC: 32.7 g/dL (ref 30.0–36.0)
MCV: 87.7 fL (ref 80.0–100.0)
Platelets: 257 10*3/uL (ref 150–400)
RBC: 4.95 MIL/uL (ref 4.22–5.81)
RDW: 12.7 % (ref 11.5–15.5)
WBC: 8.6 10*3/uL (ref 4.0–10.5)
nRBC: 0 % (ref 0.0–0.2)

## 2022-03-18 LAB — BASIC METABOLIC PANEL
Anion gap: 8 (ref 5–15)
BUN: 19 mg/dL (ref 8–23)
CO2: 21 mmol/L — ABNORMAL LOW (ref 22–32)
Calcium: 8.7 mg/dL — ABNORMAL LOW (ref 8.9–10.3)
Chloride: 106 mmol/L (ref 98–111)
Creatinine, Ser: 1.7 mg/dL — ABNORMAL HIGH (ref 0.61–1.24)
GFR, Estimated: 45 mL/min — ABNORMAL LOW (ref >60)
Glucose, Bld: 128 mg/dL — ABNORMAL HIGH (ref 70–99)
Potassium: 3.7 mmol/L (ref 3.5–5.1)
Sodium: 135 mmol/L (ref 135–145)

## 2022-03-18 LAB — GLUCOSE, CAPILLARY: Glucose-Capillary: 104 mg/dL — ABNORMAL HIGH (ref 70–99)

## 2022-03-18 MED ORDER — SACUBITRIL-VALSARTAN 24-26 MG PO TABS: 1.0000 | ORAL_TABLET | Freq: Two times a day (BID) | ORAL | 1 refills | Status: DC

## 2022-03-18 MED ORDER — HYDRALAZINE HCL 25 MG PO TABS: 25.0000 mg | ORAL_TABLET | Freq: Three times a day (TID) | ORAL | 1 refills | Status: DC

## 2022-03-18 NOTE — Progress Notes (Signed)
Tristan Villegas to be D/C'd Home per MD order.  Discussed prescriptions and follow up appointments with the patient. Prescriptions given to patient, medication list explained in detail. Pt verbalized understanding. ? ?Allergies as of 03/18/2022   ?No Known Allergies ?  ? ?  ?Medication List  ?  ? ?TAKE these medications   ? ?diphenhydrAMINE 25 MG tablet ?Commonly known as: BENADRYL ?Take 25 mg by mouth daily. ?  ?hydrALAZINE 25 MG tablet ?Commonly known as: APRESOLINE ?Take 1 tablet (25 mg total) by mouth every 8 (eight) hours. ?  ?sacubitril-valsartan 24-26 MG ?Commonly known as: ENTRESTO ?Take 1 tablet by mouth 2 (two) times daily. ?  ? ?  ? ? ?Vitals:  ? 03/18/22 0411 03/18/22 0743  ?BP: (!) 168/105 (!) 178/107  ?Pulse: 69 76  ?Resp: 19 18  ?Temp: 98.5 ?F (36.9 ?C) 98.1 ?F (36.7 ?C)  ?SpO2: 98% 98%  ? ? ?Skin clean, dry and intact without evidence of skin break down, no evidence of skin tears noted. IV catheter discontinued intact. Site without signs and symptoms of complications. Dressing and pressure applied. Pt denies pain at this time. No complaints noted. ? ?An After Visit Summary was printed and given to the patient. ?Patient escorted via [place], and D/C home via private auto. ? ?Tristan Villegas  ?

## 2022-03-18 NOTE — Progress Notes (Signed)
?Lucilla Lame, MD [facility]   ?[address] ?[place], [place] [address] ?Phone: [phone] ?Fax : [phone] ? ? ?Subjective: ?Patient came in with GI bleeding and has had no further GI bleeding.  The patient has taken a prep for colonoscopy but due to his cardiac status the anesthesia group did not want to put the patient to sleep.  The patient had an evaluation by cardiology who recommended the patient undergo the procedure.  The patient had a imaging study that showed him to have diverticulosis.  ? ? ?Objective: ?Vital signs in last 24 hours: ?Vitals:  ? 03/17/22 2219 03/18/22 0411 03/18/22 0413 03/18/22 0743  ?BP: (!) 164/99 (!) 168/105  (!) 178/107  ?Pulse: 73 69  76  ?Resp:  19  18  ?Temp:  98.5 ?F (36.9 ?C)  98.1 ?F (36.7 ?C)  ?TempSrc:      ?SpO2:  98%  98%  ?Weight:   105 kg   ?Height:      ? ?Weight change: -0.234 kg ? ?Intake/Output Summary (Last 24 hours) at 03/18/2022 1113 ?Last data filed at 03/18/2022 1021 ?Gross per 24 hour  ?Intake 418 ml  ?Output 1 ml  ?Net 417 ml  ? ? ? ?Exam: ?General: Patient sitting up in bed in no apparent distress ? ? ?Lab Results: ?@LABTEST2 @ ?Micro Results: ?No results found for this or any previous visit (from the past 240 hour(s)). ?Studies/Results: ?ECHOCARDIOGRAM COMPLETE ? ?Result Date: 03/17/2022 ?   ECHOCARDIOGRAM REPORT   Patient Name:   Tristan Villegas Date of Exam: 03/17/2022 Medical Rec #:  [id number]       Height:       72.0 in Accession #:    [id number]      Weight:       235.7 lb Date of Birth:  Nov 15, 1961        BSA:          2.285 m? Patient Age:    61 years        BP:           172/90 mmHg Patient Gender: M               HR:           72 bpm. Exam Location:  [facility] Procedure: 2D Echo, Color Doppler, Cardiac Doppler and Intracardiac            Opacification Agent Indications:     I50.9 Congestive Heart Failure  History:         Patient has prior history of Echocardiogram examinations. CKD;                  Risk Factors:Hypertension.  Sonographer:     Charmayne Sheer  Referring Phys:  [place] Diagnosing Phys: Neoma Laming  Sonographer Comments: Suboptimal apical window and no subcostal window. IMPRESSIONS  1. Left ventricular ejection fraction, by estimation, is 30 to 35%. The left ventricle has severely decreased function. The left ventricle demonstrates global hypokinesis. The left ventricular internal cavity size was severely dilated. There is moderate  concentric left ventricular hypertrophy. Left ventricular diastolic parameters are consistent with Grade II diastolic dysfunction (pseudonormalization).  2. Right ventricular systolic function is mildly reduced. The right ventricular size is mildly enlarged.  3. Left atrial size was moderately dilated.  4. Right atrial size was moderately dilated.  5. The mitral valve is degenerative. Mild mitral valve regurgitation.  6. The aortic valve is calcified. Aortic valve regurgitation is trivial. Aortic  valve sclerosis/calcification is present, without any evidence of aortic stenosis. FINDINGS  Left Ventricle: Left ventricular ejection fraction, by estimation, is 30 to 35%. The left ventricle has severely decreased function. The left ventricle demonstrates global hypokinesis. Definity contrast agent was given IV to delineate the left ventricular endocardial borders. The left ventricular internal cavity size was severely dilated. There is moderate concentric left ventricular hypertrophy. Left ventricular diastolic parameters are consistent with Grade II diastolic dysfunction (pseudonormalization). Right Ventricle: The right ventricular size is mildly enlarged. No increase in right ventricular wall thickness. Right ventricular systolic function is mildly reduced. Left Atrium: Left atrial size was moderately dilated. Right Atrium: Right atrial size was moderately dilated. Pericardium: There is no evidence of pericardial effusion. Mitral Valve: The mitral valve is degenerative in appearance. Mild mitral valve regurgitation.  MV peak gradient, 4.2 mmHg. The mean mitral valve gradient is 2.0 mmHg. Tricuspid Valve: The tricuspid valve is grossly normal. Tricuspid valve regurgitation is mild. Aortic Valve: The aortic valve is calcified. Aortic valve regurgitation is trivial. Aortic valve sclerosis/calcification is present, without any evidence of aortic stenosis. Aortic valve mean gradient measures 6.0 mmHg. Aortic valve peak gradient measures 10.9 mmHg. Aortic valve area, by VTI measures 2.49 cm?. Pulmonic Valve: The pulmonic valve was grossly normal. Pulmonic valve regurgitation is mild. Aorta: The aortic root, ascending aorta and aortic arch are all structurally normal, with no evidence of dilitation or obstruction. IAS/Shunts: The interatrial septum was not assessed.  LEFT VENTRICLE PLAX 2D LVIDd:         6.04 cm      Diastology LVIDs:         5.34 cm      LV e' medial:    5.22 cm/s LV PW:         1.34 cm      LV E/e' medial:  19.1 LV IVS:        1.21 cm      LV e' lateral:   6.31 cm/s LVOT diam:     2.40 cm      LV E/e' lateral: 15.8 LV SV:         68 LV SV Index:   30 LVOT Area:     4.52 cm?  LV Volumes (MOD) LV vol d, MOD A4C: 167.0 ml LV vol s, MOD A4C: 103.0 ml LV SV MOD A4C:     167.0 ml RIGHT VENTRICLE RV Basal diam:  4.09 cm LEFT ATRIUM              Index        RIGHT ATRIUM           Index LA diam:        3.90 cm  1.71 cm/m?   RA Area:     15.60 cm? LA Vol (A2C):   59.7 ml  26.13 ml/m?  RA Volume:   43.20 ml  18.91 ml/m? LA Vol (A4C):   106.0 ml 46.40 ml/m? LA Biplane Vol: 86.2 ml  37.73 ml/m?  AORTIC VALVE                     PULMONIC VALVE AV Area (Vmax):    2.57 cm?      PV Vmax:       1.03 m/s AV Area (Vmean):   2.36 cm?      PV Vmean:      66.500 cm/s AV Area (VTI):     2.49 cm?      PV  VTI:        0.158 m AV Vmax:           165.00 cm/s   PV Peak grad:  4.2 mmHg AV Vmean:          120.000 cm/s  PV Mean grad:  2.0 mmHg AV VTI:            0.273 m AV Peak Grad:      10.9 mmHg AV Mean Grad:      6.0 mmHg LVOT Vmax:          93.80 cm/s LVOT Vmean:        62.500 cm/s LVOT VTI:          0.150 m LVOT/AV VTI ratio: 0.55  AORTA Ao Root diam: 3.30 cm MITRAL VALVE MV Area (PHT): 3.74 cm?    SHUNTS MV Area VTI:   1.90 cm?    Systemic VTI:  0.15 m MV Peak grad:  4.2 mmHg    Systemic Diam: 2.40 cm MV Mean grad:  2.0 mmHg MV Vmax:       1.02 m/s MV Vmean:      71.7 cm/s MV Decel Time: 203 msec MV E velocity: 99.80 cm/s MV A velocity: 78.40 cm/s MV E/A ratio:  1.27 Neoma Laming Electronically signed by Neoma Laming Signature Date/Time: 03/17/2022/6:20:31 PM    Final   ? ?CT ANGIO GI BLEED ? ?Result Date: 03/16/2022 ?CLINICAL DATA:  Rectal bleeding, no pain EXAM: CTA ABDOMEN AND PELVIS WITHOUT AND WITH CONTRAST TECHNIQUE: Multidetector CT imaging of the abdomen and pelvis was performed using the standard protocol during bolus administration of intravenous contrast. Multiplanar reconstructed images and MIPs were obtained and reviewed to evaluate the vascular anatomy. RADIATION DOSE REDUCTION: This exam was performed according to the departmental dose-optimization program which includes automated exposure control, adjustment of the mA and/or kV according to patient size and/or use of iterative reconstruction technique. CONTRAST:  157mL OMNIPAQUE IOHEXOL 350 MG/ML SOLN COMPARISON:  None. FINDINGS: VASCULAR Normal contour and caliber of the abdominal aorta. No evidence of aneurysm, dissection, or other acute aortic pathology. Standard branching pattern of the abdominal aorta, with solitary bilateral renal arteries. The branch vessel ostia are patent. Mild mixed calcific atherosclerosis. Review of the MIP images confirms the above findings. NON-VASCULAR Lower chest: No acute abnormality. Cardiomegaly. Coronary artery calcifications. Hepatobiliary: No solid liver abnormality is seen. No gallstones, gallbladder wall thickening, or biliary dilatation. Pancreas: Unremarkable. No pancreatic ductal dilatation or surrounding inflammatory changes. Spleen: Normal in  size without significant abnormality. Adrenals/Urinary Tract: Adrenal glands are unremarkable. Punctuate bilateral nonobstructive renal calculi of the inferior poles. No ureteral calculi or hydronephrosi

## 2022-03-18 NOTE — Progress Notes (Signed)
SUBJECTIVE: Patient feeling much better denies any chest pain shortness of breath ? ? ?Vitals:  ? 03/17/22 2219 03/18/22 0411 03/18/22 0413 03/18/22 0743  ?BP: (!) 164/99 (!) 168/105  (!) 178/107  ?Pulse: 73 69  76  ?Resp:  19  18  ?Temp:  98.5 ?F (36.9 ?C)  98.1 ?F (36.7 ?C)  ?TempSrc:      ?SpO2:  98%  98%  ?Weight:   105 kg   ?Height:      ? ? ?Intake/Output Summary (Last 24 hours) at 03/18/2022 0843 ?Last data filed at 03/18/2022 [id number] ?Gross per 24 hour  ?Intake 238 ml  ?Output 1 ml  ?Net 237 ml  ? ? ?LABS: ?Basic Metabolic Panel: ?Recent Labs  ?  03/16/22 ?1021 03/17/22 ?0654  ?NA 141 138  ?K 3.8 3.9  ?CL 110 109  ?CO2 20* 20*  ?GLUCOSE 105* 114*  ?BUN 32* 18  ?CREATININE 1.84* 1.50*  ?CALCIUM 8.7* 8.6*  ? ?Liver Function Tests: ?Recent Labs  ?  03/16/22 ?1021  ?AST 23  ?ALT 25  ?ALKPHOS 57  ?BILITOT 0.9  ?PROT 7.4  ?ALBUMIN 3.8  ? ?No results for input(s): LIPASE, AMYLASE in the last 72 hours. ?CBC: ?Recent Labs  ?  03/17/22 ?2254 03/18/22 ?[id number]  ?WBC 8.1 8.6  ?HGB 13.2 14.2  ?HCT 40.5 43.4  ?MCV 88.8 87.7  ?PLT 248 257  ? ?Cardiac Enzymes: ?No results for input(s): CKTOTAL, CKMB, CKMBINDEX, TROPONINI in the last 72 hours. ?BNP: ?Invalid input(s): POCBNP ?D-Dimer: ?No results for input(s): DDIMER in the last 72 hours. ?Hemoglobin A1C: ?No results for input(s): HGBA1C in the last 72 hours. ?Fasting Lipid Panel: ?No results for input(s): CHOL, HDL, LDLCALC, TRIG, CHOLHDL, LDLDIRECT in the last 72 hours. ?Thyroid Function Tests: ?No results for input(s): TSH, T4TOTAL, T3FREE, THYROIDAB in the last 72 hours. ? ?Invalid input(s): FREET3 ?Anemia Panel: ?No results for input(s): VITAMINB12, FOLATE, FERRITIN, TIBC, IRON, RETICCTPCT in the last 72 hours. ? ? ?PHYSICAL EXAM ?General: Well developed, well nourished, in no acute distress ?HEENT:  Normocephalic and atramatic ?Neck:  No JVD.  ?Lungs: Clear bilaterally to auscultation and percussion. ?Heart: HRRR . Normal S1 and S2 without gallops or murmurs.  ?Abdomen: Bowel  sounds are positive, abdomen soft and non-tender  ?Msk:  Back normal, normal gait. Normal strength and tone for age. ?Extremities: No clubbing, cyanosis or edema.   ?Neuro: Alert and oriented X 3. ?Psych:  Good affect, responds appropriately ? ?TELEMETRY: Atrial fibrillation ? 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  Continue current medications and patient can be discharged with follow-up in the office this coming Tuesday at 9 AM. ? ?Principal Problem: ?  Rectal bleeding ?Active Problems: ?  Hypertensive urgency ?  Chronic systolic CHF (congestive heart failure) ([place]) ?  Chronic kidney disease, stage 3a ([place]) ?  Obesity (BMI 30-39.9) ?  ? ?Dionisio David, MD, FACC ?03/18/2022 ?8:43 AM ? ? ? ?  ?

## 2022-03-18 NOTE — Discharge Summary (Signed)
?Physician Discharge Summary ?  ?Patient: Tristan Villegas MRN: [id number] DOB: 03-01-1961  ?Admit date:     03/16/2022  ?Discharge date: 03/18/22  ?Discharge Physician: Annita Brod  ? ?PCP: Pcp, No  ? ?Recommendations at discharge:  ? ?Patient given referral to gastroenterology for follow-up for colonoscopy if he wishes.   ?New medication: Hydralazine 25 mg p.o. 3 times daily ?New medication: Entresto 24/26 p.o. twice daily ?Patient will follow-up with cardiology, Dr. Humphrey Rolls next week ? ?Discharge Diagnoses: ?Principal Problem: ?  Rectal bleeding ?Active Problems: ?  Chronic combined systolic (congestive) and diastolic (congestive) heart failure (HCC) ?  Hypertensive urgency ?  Chronic kidney disease, stage 3a ([place]) ?  Obesity (BMI 30-39.9) ? ?Resolved Problems: ?  * No resolved hospital problems. * ? ?Hospital Course: ?Patient is a 61 year old male with past medical history of hypertension, stage IIIa chronic kidney disease and advanced chronic systolic heart failure with ejection fraction 20%.  This was diagnosed back in November 2020 and at that time, patient left AMA prior to being seen by cardiology.  He states that he has not been on any blood pressure medications since his PCP retired.  Unclear if he has seen a cardiologist or has undergone a cardiology work-up.  He presents to the emergency room on 4/6 with episodes of rectal bleeding.  In emergency room, hemoglobin stable creatinine of 1.84.  BNP of 715.  Brought into the hospitalist service and GI consulted.   ? ?Initial plan was to take patient with colonoscopy on 4/7, however colonoscopy canceled due to anesthesia concern for ejection fraction.  Patient cleared by cardiology after, however unable to do until Monday 4/10.  Repeat echocardiogram noted improvement in ejection fraction of to 30% although has also developed diastolic dysfunction. ? ?Assessment and Plan: ?* Rectal bleeding ?Hemoglobin stable.  Suspect diverticular bleed.  Unable to do  colonoscopy on 4/7.  Patient declined waiting until 4/10.  Outpatient referral to GI.  Hemoglobin stable, at 14.2 by day of discharge ? ?Chronic combined systolic (congestive) and diastolic (congestive) heart failure (HCC) ?Patient left AMA at previous hospitalization when he was first diagnosed with congestive heart failure in November 2020.  Patient states he has not ever seen a cardiologist.  He states that he has not been on any blood pressure medications for over a year and a half since his PCP retired.  Started on Entresto and Lasix.  Seen by cardiology during this admission.  Repeat echocardiogram actually noted improvement in ejection fraction up to 30%, but now he is also developed grade 2 diastolic dysfunction. ? ?Patient denies any heavy alcohol use ever.  He used to occasionally have a drink, but has not drank in many years.  Denies any drug use.  No family history of any heart disease.  He does not recall any episodes of chest pain.  He states that at his baseline, he walks daily with his wife and does not have dyspnea on exertion or shortness of breath. ? ?Patient will follow-up with cardiology next week as outpatient.  In the meantime, will start on hydralazine and Entresto.  With 1 dose of IV Lasix, creatinine went from 1.5 to 1.7.  Appears to be euvolemic ? ?Hypertensive urgency ?Reportedly, patient has not been on any blood pressure medication since his PCP retired.  Review of his medical record, patient was seen in November 2020 and at that time, had not been taking blood pressure medications for several months because of insurance issues.  Patient's PCP retired  over a year and a half ago and patient says he has not been on any blood pressure medicine since.  Cardiology consult.  In the meantime, given his ejection fraction, mildly elevated BNP and uncontrolled medications, started on Entresto and Lasix. ? ?By following day, blood pressure still elevated and also added with hydralazine.  Will  discharge on Entresto and hydralazine.  She will see cardiology next week and at that time medications can be adjusted or changed.  Held off on beta-blocker due to existing heart rate in the high 60s to low 70s at baseline. ? ?Chronic kidney disease, stage 3a ([place]) ?At baseline.  Renal function similar to November 2020.  Creatinine on day of discharge at 1.7 with GFR 45. ? ?Obesity (BMI 30-39.9) ?Patient meets criteria with BMI greater than 30 ? ? ? ? ?  ? ? ?Consultants: Cardiology, gastroenterology ?Procedures performed: Echocardiogram: [place] fraction of 30% and grade 2 diastolic dysfunction ?Disposition: Home ?Diet recommendation:  ?Discharge Diet Orders (From admission, onward)  ? ?  Start     Ordered  ? 03/18/22 0000  Diet - low sodium heart healthy       ? 03/18/22 1059  ? ?  ?  ? ?  ? ?Cardiac diet ?DISCHARGE MEDICATION: ?Allergies as of 03/18/2022   ?No Known Allergies ?  ? ?  ?Medication List  ?  ? ?TAKE these medications   ? ?diphenhydrAMINE 25 MG tablet ?Commonly known as: BENADRYL ?Take 25 mg by mouth daily. ?  ?hydrALAZINE 25 MG tablet ?Commonly known as: APRESOLINE ?Take 1 tablet (25 mg total) by mouth every 8 (eight) hours. ?  ?sacubitril-valsartan 24-26 MG ?Commonly known as: ENTRESTO ?Take 1 tablet by mouth 2 (two) times daily. ?  ? ?  ? ? Follow-up Information   ? ? Dionisio David, MD. Schedule an appointment as soon as possible for a visit on 03/21/2022.   ?Specialty: Cardiology ?Contact information: ?[address] ?[place] [address] ?[phone] ? ? ?  ?  ? ?  ?  ? ?  ? ?Discharge Exam: ?Filed Weights  ? 03/16/22 1004 03/17/22 0421 03/18/22 0413  ?Weight: 105.2 kg 106.9 kg 105 kg  ? ?General: Alert and oriented x3, no acute distress ?Cardiovascular: Regular rate and rhythm, S1-S2 ?Lungs: Clear to auscultation bilaterally ? ?Condition at discharge: good ? ?The results of significant diagnostics from this hospitalization (including imaging, microbiology, ancillary and laboratory) are listed  below for reference.  ? ?Imaging Studies: ?ECHOCARDIOGRAM COMPLETE ? ?Result Date: 03/17/2022 ?   ECHOCARDIOGRAM REPORT   Patient Name:   Tristan Villegas Date of Exam: 03/17/2022 Medical Rec #:  [id number]       Height:       72.0 in Accession #:    [id number]      Weight:       235.7 lb Date of Birth:  02/08/1961        BSA:          2.285 m? Patient Age:    83 years        BP:           172/90 mmHg Patient Gender: M               HR:           72 bpm. Exam Location:  [facility] Procedure: 2D Echo, Color Doppler, Cardiac Doppler and Intracardiac            Opacification Agent Indications:     I50.9  Congestive Heart Failure  History:         Patient has prior history of Echocardiogram examinations. CKD;                  Risk Factors:Hypertension.  Sonographer:     Charmayne Sheer Referring Phys:  [place] Diagnosing Phys: Neoma Laming  Sonographer Comments: Suboptimal apical window and no subcostal window. IMPRESSIONS  1. Left ventricular ejection fraction, by estimation, is 30 to 35%. The left ventricle has severely decreased function. The left ventricle demonstrates global hypokinesis. The left ventricular internal cavity size was severely dilated. There is moderate  concentric left ventricular hypertrophy. Left ventricular diastolic parameters are consistent with Grade II diastolic dysfunction (pseudonormalization).  2. Right ventricular systolic function is mildly reduced. The right ventricular size is mildly enlarged.  3. Left atrial size was moderately dilated.  4. Right atrial size was moderately dilated.  5. The mitral valve is degenerative. Mild mitral valve regurgitation.  6. The aortic valve is calcified. Aortic valve regurgitation is trivial. Aortic valve sclerosis/calcification is present, without any evidence of aortic stenosis. FINDINGS  Left Ventricle: Left ventricular ejection fraction, by estimation, is 30 to 35%. The left ventricle has severely decreased function. The left ventricle demonstrates  global hypokinesis. Definity contrast agent was given IV to delineate the left ventricular endocardial borders. The left ventricular internal cavity size was severely dilated. There is moderate concentric left ventric

## 2022-03-18 NOTE — Plan of Care (Signed)
?  Problem: Education: ?Goal: Knowledge of General Education information will improve ?Description: Including pain rating scale, medication(s)/side effects and non-pharmacologic comfort measures ?Outcome: Not Progressing ? ?Patient does not understand what is causing blood pressure to be high and would like more education on ways to manage it.  ?  ?Problem: Clinical Measurements: ?Goal: Ability to maintain clinical measurements within normal limits will improve ?Outcome: Progressing ?Goal: Diagnostic test results will improve ?Outcome: Progressing ?  ?

## 2022-03-18 NOTE — TOC Initial Note (Signed)
Transition of Care (TOC) - Initial/Assessment Note  ? ? ?Patient Details  ?Name: Tristan Villegas ?MRN: [id number] ?Date of Birth: [birth year] ? ?Transition of Care (TOC) CM/SW Contact:    ?Caroll Weinheimer E Juana Montini, LCSW ?Phone Number: ?03/18/2022, 9:31 AM ? ?Clinical Narrative:                 ?Per rounds on 4/7, patient could benefit from [facility] and does not have a PCP. ?CSW spoke with patient. Patient states he does not want a HHRN, states he lives with someone who can help him at home. Patient states he had a PCP but they retired but he went to their office last week to see a different provider. Patient did not know the name of the office, said it is "Family Practice." Patient says he is working on establishing a new PCP there and denied needing resources or assistance with this.  ?Patient says he uses Education officer, community and has no DME, HH, or SNF needs.  ? ?Expected Discharge Plan: Home/Self Care ?Barriers to Discharge: Continued Medical Work up ? ? ?Patient Goals and CMS Choice ?Patient states their goals for this hospitalization and ongoing recovery are:: to return home ?CMS Medicare.gov Compare Post Acute Care list provided to:: Patient ?Choice offered to / list presented to : Patient ? ?Expected Discharge Plan and Services ?Expected Discharge Plan: Home/Self Care ?  ?  ?  ?Living arrangements for the past 2 months: [place] ?                ?  ?  ?  ?  ?  ?  ?  ?  ?  ?  ? ?Prior Living Arrangements/Services ?Living arrangements for the past 2 months: [place] ?Lives with:: Roommate ?Patient language and need for interpreter reviewed:: Yes ?Do you feel safe going back to the place where you live?: Yes      ?Need for Family Participation in Patient Care: Yes (Comment) ?Care giver support system in place?: Yes (comment) ?  ?Criminal Activity/Legal Involvement Pertinent to Current Situation/Hospitalization: No - Comment as needed ? ?Activities of Daily Living ?Home Assistive Devices/Equipment:  Eyeglasses ?ADL Screening (condition at time of admission) ?Patient's cognitive ability adequate to safely complete daily activities?: Yes ?Is the patient deaf or have difficulty hearing?: No ?Does the patient have difficulty seeing, even when wearing glasses/contacts?: No ?Does the patient have difficulty concentrating, remembering, or making decisions?: No ?Patient able to express need for assistance with ADLs?: Yes ?Does the patient have difficulty dressing or bathing?: No ?Independently performs ADLs?: Yes (appropriate for developmental age) ?Does the patient have difficulty walking or climbing stairs?: No ?Weakness of Legs: None ?Weakness of Arms/Hands: None ? ?Permission Sought/Granted ?  ?  ?   ?   ?   ?   ? ?Emotional Assessment ?  ?  ?  ?Orientation: : Oriented to Self, Oriented to Place, Oriented to  Time, Oriented to Situation ?Alcohol / Substance Use: Not Applicable ?Psych Involvement: No (comment) ? ?Admission diagnosis:  Rectal bleeding [K62.5] ?Gastrointestinal hemorrhage, unspecified gastrointestinal hemorrhage type [K92.2] ?Patient Active Problem List  ? Diagnosis Date Noted  ? Obesity (BMI 30-39.9) 03/17/2022  ? Rectal bleeding 03/16/2022  ? Chronic systolic CHF (congestive heart failure) ([place]) 03/16/2022  ? Hypertension 03/16/2022  ? Chronic kidney disease, stage 3a ([place]) 03/16/2022  ? Acute CHF (congestive heart failure) ([place]) 10/17/2019  ? Hypertensive urgency 10/17/2019  ? AKI (acute kidney injury) ([place]) 10/17/2019  ? Lower extremity  cellulitis 10/17/2019  ? ?PCP:  Pcp, No ?Pharmacy:   ?[facility] [address] [place] ?[place] ?[place] [address] ?Phone: [phone] Fax: [phone] ? ? ? ? ?Social Determinants of Health (SDOH) Interventions ?  ? ?Readmission Risk Interventions ?   ? View : No data to display.  ?  ?  ?  ? ? ? ?

## 2022-09-04 IMAGING — CT CTA GI BLEED
2 of 16 series · 11 of 46 positions shown, 18 images · IV contrast (agent unspecified)
Comparison: None.

CLINICAL DATA: Rectal bleeding, no pain

EXAM:
CTA ABDOMEN AND PELVIS WITHOUT AND WITH CONTRAST
TECHNIQUE: Multidetector CT imaging of the abdomen and pelvis was performed
using the standard protocol during bolus administration of
intravenous contrast. Multiplanar reconstructed images and MIPs were
obtained and reviewed to evaluate the vascular anatomy.

[Series 12: cor · coronal · 0.91mm/px · 1 of 151 slices shown, 2 images]
[im 76/151  soft-tissue]
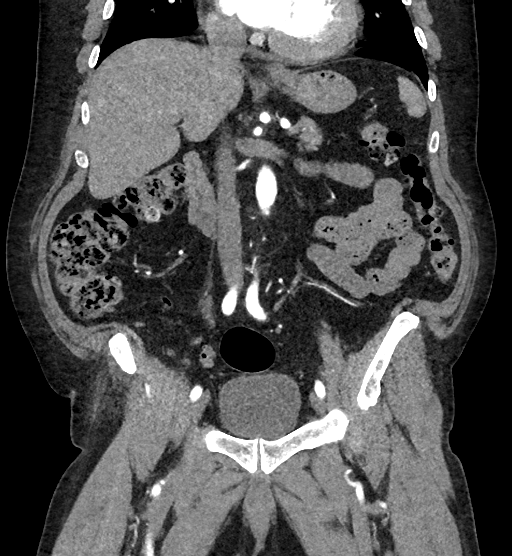
[im 76/151  bone]
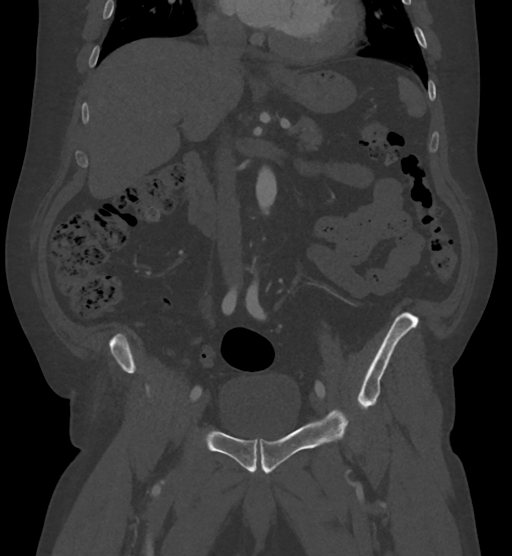

[Series 17: venous thins · axial · portal-venous · 0.88mm/px · z∈[-1013,-600]mm · 10 of 1261 slices shown, 16 images]
[im 115/1261  soft-tissue]
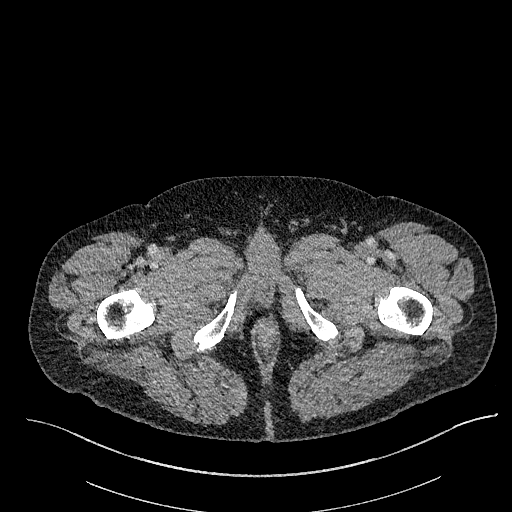
[im 115/1261  bone]
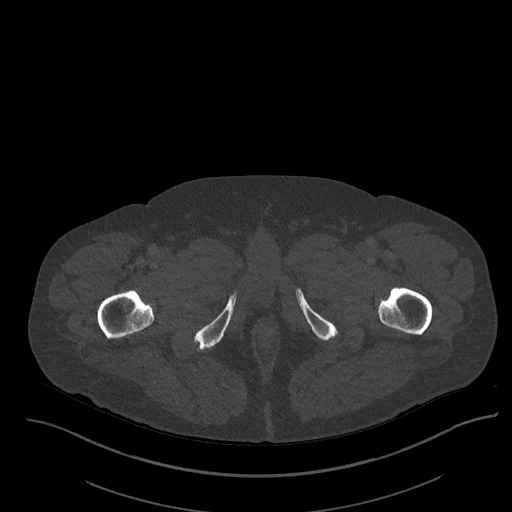
[im 230/1261  soft-tissue]
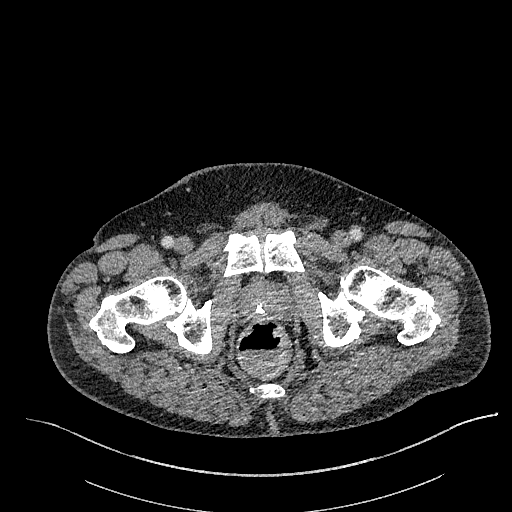
[im 344/1261  soft-tissue]
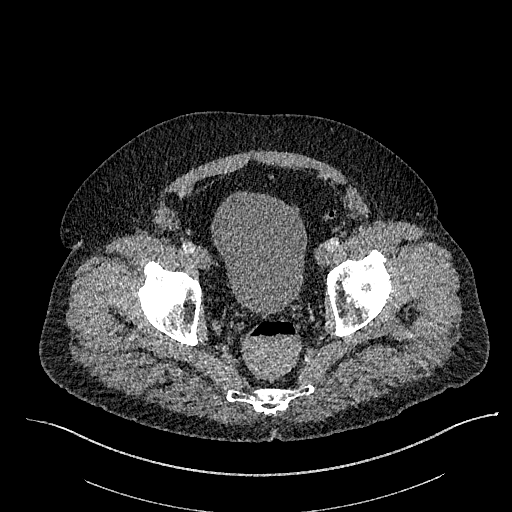
[im 459/1261  soft-tissue]
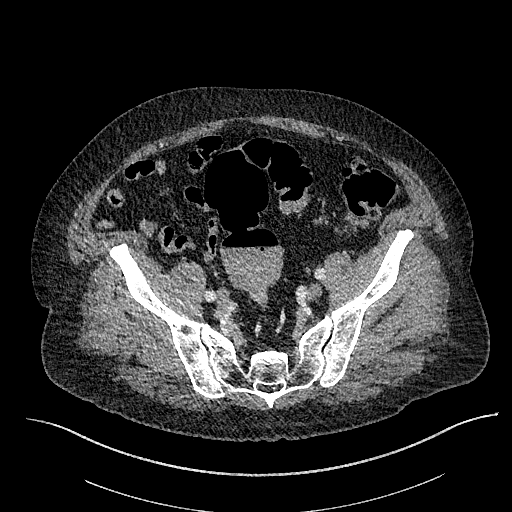
[im 573/1261  soft-tissue]
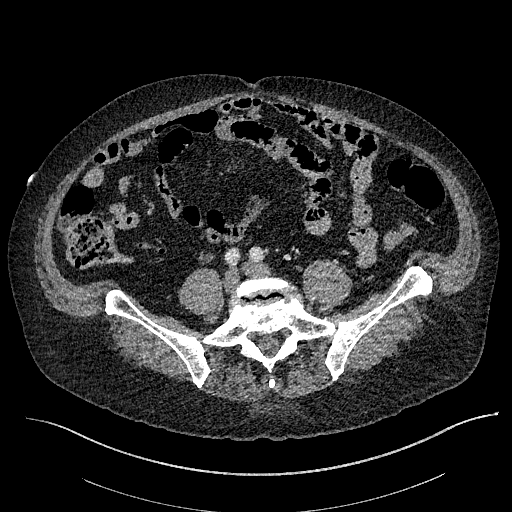
[im 688/1261  soft-tissue]
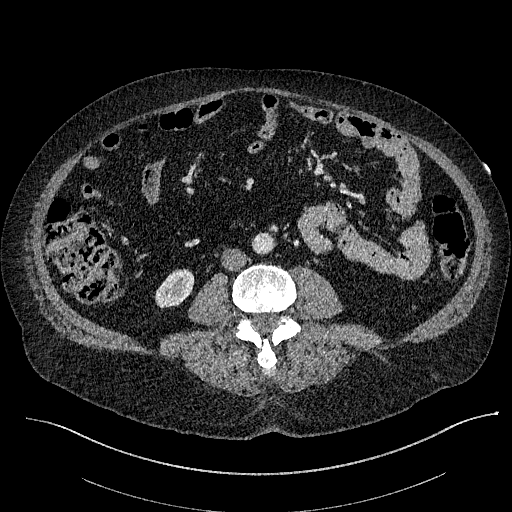
[im 802/1261  soft-tissue]
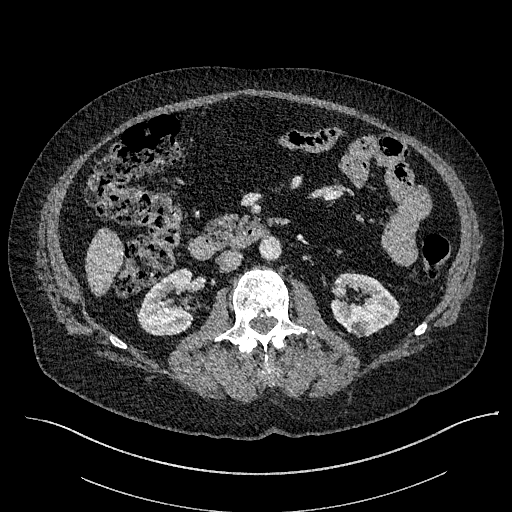
[im 802/1261  lung]
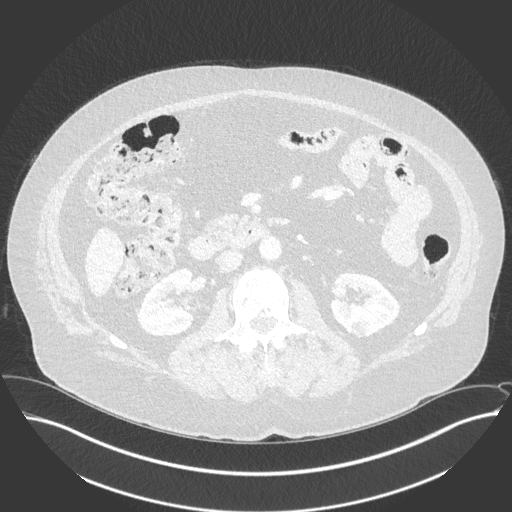
[im 917/1261  soft-tissue]
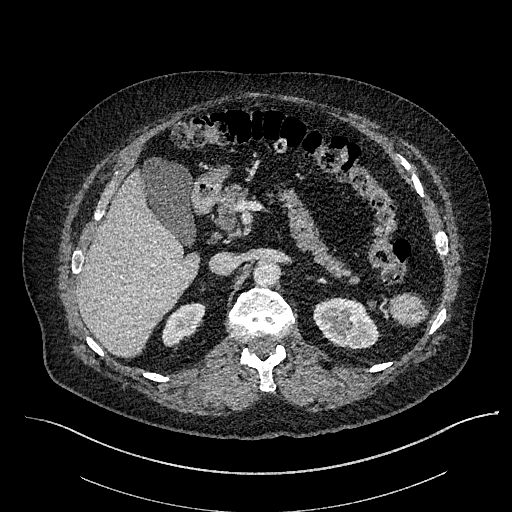
[im 917/1261  lung]
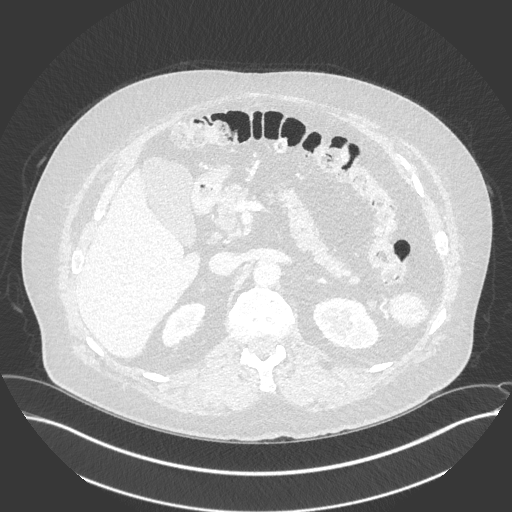
[im 1031/1261  soft-tissue]
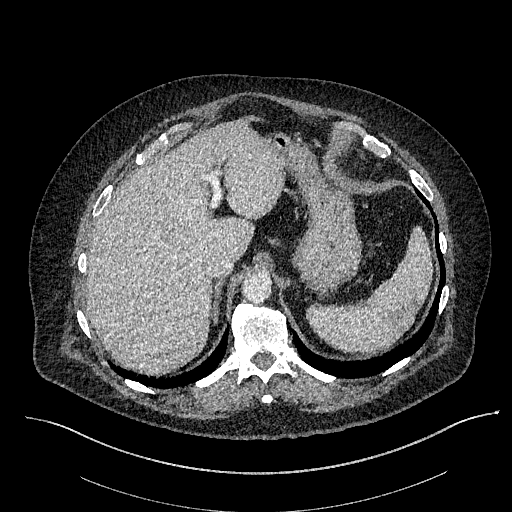
[im 1031/1261  lung]
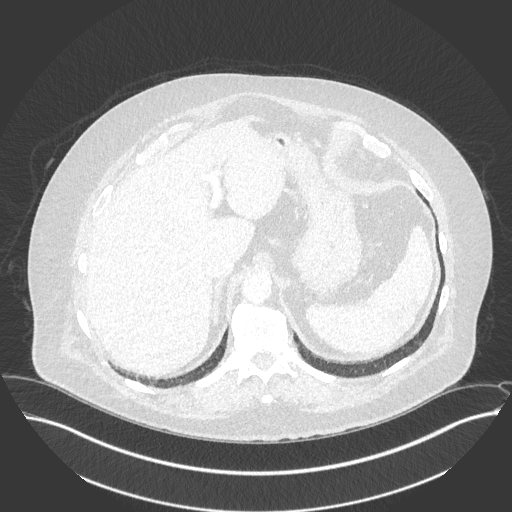
[im 1031/1261  bone]
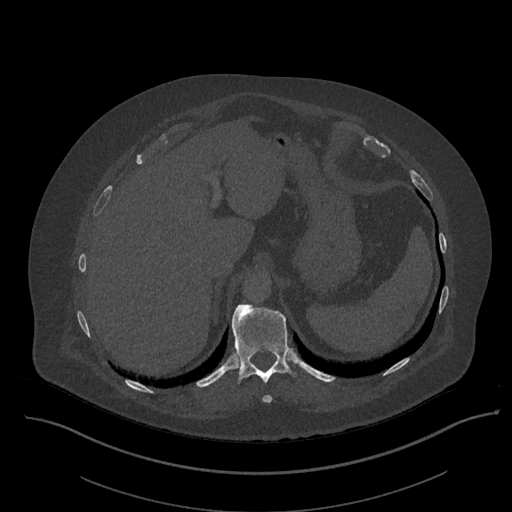
[im 1146/1261  soft-tissue]
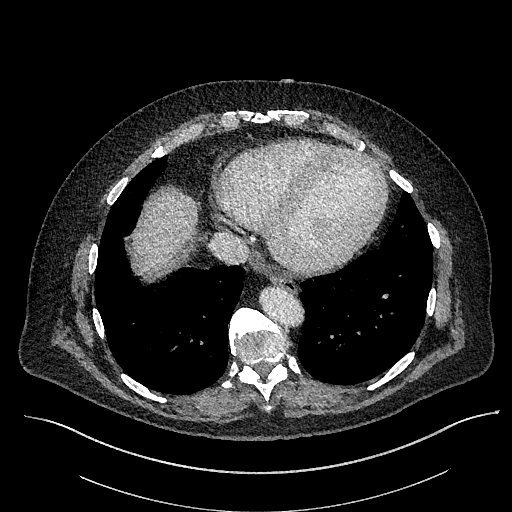
[im 1146/1261  lung]
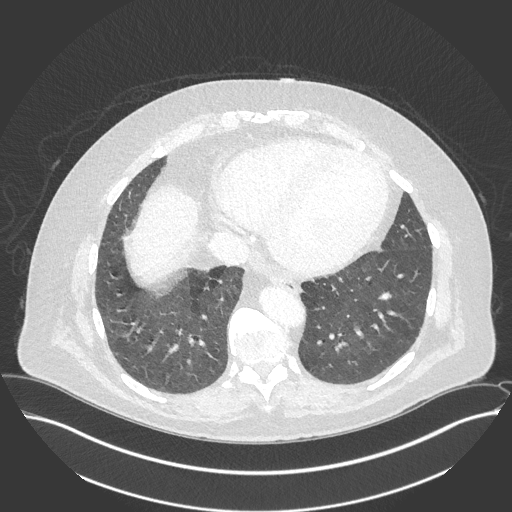

[11 of 46 positions shown; findings below may reference images not displayed]

RADIATION DOSE REDUCTION: This exam was performed according to the
departmental dose-optimization program which includes automated
exposure control, adjustment of the mA and/or kV according to
patient size and/or use of iterative reconstruction technique.

CONTRAST:  100mL OMNIPAQUE IOHEXOL 350 MG/ML SOLN
FINDINGS: VASCULAR

Normal contour and caliber of the abdominal aorta. No evidence of
aneurysm, dissection, or other acute aortic pathology. Standard
branching pattern of the abdominal aorta, with solitary bilateral
renal arteries. The branch vessel ostia are patent. Mild mixed
calcific atherosclerosis.

Review of the MIP images confirms the above findings.

NON-VASCULAR

Lower chest: No acute abnormality. Cardiomegaly. Coronary artery
calcifications.

Hepatobiliary: No solid liver abnormality is seen. No gallstones,
gallbladder wall thickening, or biliary dilatation.

Pancreas: Unremarkable. No pancreatic ductal dilatation or
surrounding inflammatory changes.

Spleen: Normal in size without significant abnormality.

Adrenals/Urinary Tract: Adrenal glands are unremarkable. Punctuate
bilateral nonobstructive renal calculi of the inferior poles. No
ureteral calculi or hydronephrosis. Bladder is unremarkable.

Stomach/Bowel: Stomach is within normal limits. Appendix appears
normal. No evidence of bowel wall thickening, distention, or
inflammatory changes. Pancolonic diverticulosis. Fluid-filled
rectum. No intraluminal contrast extravasation on multiphasic
imaging to localize GI bleeding.

Lymphatic: No enlarged abdominal or pelvic lymph nodes.

Reproductive: No mass or other significant abnormality.

Other: No abdominal wall hernia or abnormality. No ascites.

Musculoskeletal: No acute or significant osseous findings.
IMPRESSION: 1. No intraluminal contrast extravasation on multiphasic imaging to
localize GI bleeding.
2. Pancolonic diverticulosis without evidence of acute
diverticulitis.
3. Normal contour and caliber of the abdominal aorta. No acute
pathology. Mild atherosclerosis
4. Punctuate bilateral nonobstructive renal calculi.
5. Cardiomegaly and coronary artery disease.

Aortic Atherosclerosis ([id number]-AMI.I).

## 2022-10-30 DIAGNOSIS — I517 Cardiomegaly: Secondary | ICD-10-CM | POA: Diagnosis not present

## 2022-11-03 DIAGNOSIS — I10 Essential (primary) hypertension: Secondary | ICD-10-CM | POA: Diagnosis not present

## 2022-11-03 DIAGNOSIS — I509 Heart failure, unspecified: Secondary | ICD-10-CM | POA: Diagnosis not present

## 2022-11-03 DIAGNOSIS — I2510 Atherosclerotic heart disease of native coronary artery without angina pectoris: Secondary | ICD-10-CM | POA: Diagnosis not present

## 2022-11-03 DIAGNOSIS — I429 Cardiomyopathy, unspecified: Secondary | ICD-10-CM | POA: Diagnosis not present

## 2022-11-03 DIAGNOSIS — R0602 Shortness of breath: Secondary | ICD-10-CM | POA: Diagnosis not present

## 2022-11-03 DIAGNOSIS — I340 Nonrheumatic mitral (valve) insufficiency: Secondary | ICD-10-CM | POA: Diagnosis not present

## 2022-11-03 DIAGNOSIS — I351 Nonrheumatic aortic (valve) insufficiency: Secondary | ICD-10-CM | POA: Diagnosis not present

## 2022-11-03 DIAGNOSIS — E782 Mixed hyperlipidemia: Secondary | ICD-10-CM | POA: Diagnosis not present

## 2022-11-03 DIAGNOSIS — E669 Obesity, unspecified: Secondary | ICD-10-CM | POA: Diagnosis not present

## 2023-02-23 ENCOUNTER — Other Ambulatory Visit: Payer: Self-pay

## 2023-02-23 MED ORDER — SACUBITRIL-VALSARTAN 24-26 MG PO TABS: 1.0000 | ORAL_TABLET | Freq: Two times a day (BID) | ORAL | 1 refills | Status: DC

## 2023-02-27 ENCOUNTER — Other Ambulatory Visit: Payer: Self-pay | Admitting: Cardiovascular Disease

## 2023-02-27 DIAGNOSIS — I10 Essential (primary) hypertension: Secondary | ICD-10-CM

## 2023-03-05 ENCOUNTER — Encounter: Payer: Self-pay | Admitting: Cardiovascular Disease

## 2023-03-05 ENCOUNTER — Ambulatory Visit: Payer: No Typology Code available for payment source | Admitting: Cardiovascular Disease

## 2023-03-05 VITALS — BP 172/118 | HR 60 | Ht 72.0 in | Wt 258.8 lb

## 2023-03-05 DIAGNOSIS — I5042 Chronic combined systolic (congestive) and diastolic (congestive) heart failure: Secondary | ICD-10-CM

## 2023-03-05 DIAGNOSIS — I10 Essential (primary) hypertension: Secondary | ICD-10-CM | POA: Diagnosis not present

## 2023-03-05 DIAGNOSIS — N1831 Chronic kidney disease, stage 3a: Secondary | ICD-10-CM

## 2023-03-05 DIAGNOSIS — E669 Obesity, unspecified: Secondary | ICD-10-CM

## 2023-03-05 DIAGNOSIS — I5021 Acute systolic (congestive) heart failure: Secondary | ICD-10-CM | POA: Diagnosis not present

## 2023-03-05 MED ORDER — HYDRALAZINE HCL 100 MG PO TABS: 100.0000 mg | ORAL_TABLET | Freq: Four times a day (QID) | ORAL | 11 refills | Status: DC

## 2023-03-05 MED ORDER — CARVEDILOL 25 MG PO TABS
25.0000 mg | ORAL_TABLET | Freq: Two times a day (BID) | ORAL | 3 refills | Status: DC
Start: 2023-03-05 — End: 2023-04-02

## 2023-03-05 MED ORDER — CARVEDILOL 25 MG PO TABS: 25.0000 mg | ORAL_TABLET | Freq: Two times a day (BID) | ORAL | 3 refills | Status: DC

## 2023-03-05 MED ORDER — SACUBITRIL-VALSARTAN 49-51 MG PO TABS: 1.0000 | ORAL_TABLET | Freq: Two times a day (BID) | ORAL | Status: DC

## 2023-03-05 NOTE — Assessment & Plan Note (Signed)
Will increase hydralizine to 100 bid and entresto to 49 bid
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
## 2023-03-05 NOTE — Assessment & Plan Note (Addendum)
Will increase entresto to 49 mg bidRan out of coreg this morning will refill

## 2023-03-05 NOTE — Addendum Note (Signed)
Addended by: Neoma Laming A on: 03/05/2023 09:28 AM   Modules accepted: Orders

## 2023-03-26 ENCOUNTER — Other Ambulatory Visit: Payer: Self-pay | Admitting: Cardiovascular Disease

## 2023-04-02 ENCOUNTER — Ambulatory Visit: Payer: No Typology Code available for payment source | Admitting: Cardiovascular Disease

## 2023-04-02 ENCOUNTER — Other Ambulatory Visit: Payer: Self-pay | Admitting: Cardiovascular Disease

## 2023-04-06 ENCOUNTER — Encounter: Payer: Self-pay | Admitting: Cardiovascular Disease

## 2023-04-06 ENCOUNTER — Ambulatory Visit: Payer: No Typology Code available for payment source | Admitting: Cardiovascular Disease

## 2023-04-06 VITALS — BP 144/86 | HR 65 | Ht 71.0 in | Wt 255.6 lb

## 2023-04-06 DIAGNOSIS — I5042 Chronic combined systolic (congestive) and diastolic (congestive) heart failure: Secondary | ICD-10-CM

## 2023-04-06 DIAGNOSIS — I5021 Acute systolic (congestive) heart failure: Secondary | ICD-10-CM

## 2023-04-06 DIAGNOSIS — I160 Hypertensive urgency: Secondary | ICD-10-CM

## 2023-04-06 DIAGNOSIS — I10 Essential (primary) hypertension: Secondary | ICD-10-CM | POA: Diagnosis not present

## 2023-04-06 DIAGNOSIS — E669 Obesity, unspecified: Secondary | ICD-10-CM

## 2023-04-06 MED ORDER — CARVEDILOL 25 MG PO TABS: ORAL_TABLET | ORAL | 3 refills | Status: DC

## 2023-04-06 NOTE — Assessment & Plan Note (Signed)
BP still elevated, change coreg 50 bid
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
## 2023-04-16 ENCOUNTER — Other Ambulatory Visit: Payer: Self-pay | Admitting: Cardiovascular Disease

## 2023-04-20 ENCOUNTER — Other Ambulatory Visit: Payer: No Typology Code available for payment source

## 2023-04-25 ENCOUNTER — Other Ambulatory Visit: Payer: Self-pay

## 2023-04-25 ENCOUNTER — Other Ambulatory Visit: Payer: Self-pay | Admitting: Cardiovascular Disease

## 2023-04-27 ENCOUNTER — Other Ambulatory Visit: Payer: Self-pay

## 2023-04-27 ENCOUNTER — Ambulatory Visit (INDEPENDENT_AMBULATORY_CARE_PROVIDER_SITE_OTHER): Payer: No Typology Code available for payment source

## 2023-04-27 ENCOUNTER — Ambulatory Visit: Payer: No Typology Code available for payment source | Admitting: Cardiovascular Disease

## 2023-04-27 ENCOUNTER — Other Ambulatory Visit: Payer: Self-pay | Admitting: Cardiovascular Disease

## 2023-04-27 DIAGNOSIS — E669 Obesity, unspecified: Secondary | ICD-10-CM

## 2023-04-27 DIAGNOSIS — I5042 Chronic combined systolic (congestive) and diastolic (congestive) heart failure: Secondary | ICD-10-CM

## 2023-04-27 DIAGNOSIS — I340 Nonrheumatic mitral (valve) insufficiency: Secondary | ICD-10-CM | POA: Diagnosis not present

## 2023-04-27 DIAGNOSIS — I10 Essential (primary) hypertension: Secondary | ICD-10-CM

## 2023-04-27 DIAGNOSIS — I5021 Acute systolic (congestive) heart failure: Secondary | ICD-10-CM

## 2023-04-27 DIAGNOSIS — I160 Hypertensive urgency: Secondary | ICD-10-CM

## 2023-04-27 MED ORDER — ENTRESTO 49-51 MG PO TABS: 1.0000 | ORAL_TABLET | Freq: Two times a day (BID) | ORAL | 4 refills | Status: DC

## 2023-04-27 MED ORDER — SACUBITRIL-VALSARTAN 49-51 MG PO TABS: 1.0000 | ORAL_TABLET | Freq: Two times a day (BID) | ORAL | Status: DC

## 2023-05-04 ENCOUNTER — Ambulatory Visit: Payer: No Typology Code available for payment source | Admitting: Cardiovascular Disease

## 2023-05-04 ENCOUNTER — Encounter: Payer: Self-pay | Admitting: Cardiovascular Disease

## 2023-05-04 VITALS — BP 152/94 | HR 58 | Ht 71.0 in | Wt 248.0 lb

## 2023-05-04 DIAGNOSIS — I10 Essential (primary) hypertension: Secondary | ICD-10-CM

## 2023-05-04 DIAGNOSIS — I5042 Chronic combined systolic (congestive) and diastolic (congestive) heart failure: Secondary | ICD-10-CM

## 2023-05-04 DIAGNOSIS — E669 Obesity, unspecified: Secondary | ICD-10-CM

## 2023-05-04 MED ORDER — ENTRESTO 97-103 MG PO TABS
1.0000 | ORAL_TABLET | Freq: Two times a day (BID) | ORAL | 3 refills | Status: DC
Start: 2023-05-04 — End: 2023-09-06

## 2023-05-04 NOTE — Assessment & Plan Note (Signed)
B/p elevated. Increase Entresto. CTA abdomen 03/2022 bilateral renal artery stenosis not noted.
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
## 2023-05-04 NOTE — Assessment & Plan Note (Signed)
B/p elevated. Increase Entresto to 97-103.

## 2023-05-04 NOTE — Patient Instructions (Addendum)
Increase Entresto to 97-103

## 2023-05-28 ENCOUNTER — Other Ambulatory Visit: Payer: Self-pay | Admitting: Cardiovascular Disease

## 2023-06-01 ENCOUNTER — Ambulatory Visit: Payer: No Typology Code available for payment source | Admitting: Cardiovascular Disease

## 2023-06-08 ENCOUNTER — Ambulatory Visit: Payer: No Typology Code available for payment source | Admitting: Cardiovascular Disease

## 2023-06-08 ENCOUNTER — Encounter: Payer: Self-pay | Admitting: Cardiovascular Disease

## 2023-06-08 VITALS — BP 140/90 | HR 80 | Ht 71.0 in | Wt 248.4 lb

## 2023-06-08 DIAGNOSIS — I10 Essential (primary) hypertension: Secondary | ICD-10-CM

## 2023-06-08 DIAGNOSIS — I5021 Acute systolic (congestive) heart failure: Secondary | ICD-10-CM

## 2023-06-08 DIAGNOSIS — N179 Acute kidney failure, unspecified: Secondary | ICD-10-CM

## 2023-06-08 DIAGNOSIS — E669 Obesity, unspecified: Secondary | ICD-10-CM | POA: Diagnosis not present

## 2023-06-08 MED ORDER — CARVEDILOL 25 MG PO TABS
ORAL_TABLET | ORAL | 1 refills | Status: DC
Start: 2023-06-08 — End: 2023-10-03

## 2023-06-08 MED ORDER — CARVEDILOL 25 MG PO TABS
ORAL_TABLET | ORAL | 1 refills | Status: DC
Start: 2023-06-08 — End: 2023-06-08
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
## 2023-06-15 ENCOUNTER — Other Ambulatory Visit: Payer: Self-pay | Admitting: Cardiovascular Disease

## 2023-09-05 ENCOUNTER — Other Ambulatory Visit: Payer: Self-pay | Admitting: Cardiovascular Disease

## 2023-09-05 DIAGNOSIS — I5042 Chronic combined systolic (congestive) and diastolic (congestive) heart failure: Secondary | ICD-10-CM

## 2023-09-11 ENCOUNTER — Ambulatory Visit: Payer: No Typology Code available for payment source | Admitting: Cardiovascular Disease

## 2023-09-18 ENCOUNTER — Ambulatory Visit: Payer: No Typology Code available for payment source | Admitting: Cardiovascular Disease

## 2023-09-20 ENCOUNTER — Ambulatory Visit: Payer: No Typology Code available for payment source | Admitting: Cardiovascular Disease

## 2023-09-27 ENCOUNTER — Encounter: Payer: Self-pay | Admitting: Cardiovascular Disease

## 2023-09-27 ENCOUNTER — Ambulatory Visit: Payer: No Typology Code available for payment source | Admitting: Cardiovascular Disease

## 2023-09-27 VITALS — BP 150/85 | HR 68 | Ht 71.0 in | Wt 247.0 lb

## 2023-09-27 DIAGNOSIS — E669 Obesity, unspecified: Secondary | ICD-10-CM

## 2023-09-27 DIAGNOSIS — I5042 Chronic combined systolic (congestive) and diastolic (congestive) heart failure: Secondary | ICD-10-CM

## 2023-09-27 DIAGNOSIS — I5033 Acute on chronic diastolic (congestive) heart failure: Secondary | ICD-10-CM | POA: Diagnosis not present

## 2023-09-27 DIAGNOSIS — I10 Essential (primary) hypertension: Secondary | ICD-10-CM

## 2023-09-27 DIAGNOSIS — N1831 Chronic kidney disease, stage 3a: Secondary | ICD-10-CM

## 2023-09-27 MED ORDER — AMLODIPINE BESYLATE 10 MG PO TABS: 10.0000 mg | ORAL_TABLET | Freq: Every day | ORAL | 11 refills | Status: AC
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
## 2023-10-03 ENCOUNTER — Other Ambulatory Visit: Payer: Self-pay | Admitting: Cardiovascular Disease

## 2023-10-03 DIAGNOSIS — N179 Acute kidney failure, unspecified: Secondary | ICD-10-CM

## 2023-10-03 DIAGNOSIS — E669 Obesity, unspecified: Secondary | ICD-10-CM

## 2023-10-03 DIAGNOSIS — I5021 Acute systolic (congestive) heart failure: Secondary | ICD-10-CM

## 2023-10-03 DIAGNOSIS — I10 Essential (primary) hypertension: Secondary | ICD-10-CM

## 2023-10-25 ENCOUNTER — Other Ambulatory Visit: Payer: Self-pay | Admitting: Cardiovascular Disease

## 2023-11-20 ENCOUNTER — Other Ambulatory Visit: Payer: Self-pay | Admitting: Cardiovascular Disease

## 2023-11-27 ENCOUNTER — Encounter: Payer: Self-pay | Admitting: Cardiovascular Disease

## 2023-11-27 ENCOUNTER — Ambulatory Visit: Payer: No Typology Code available for payment source | Admitting: Cardiovascular Disease

## 2023-11-27 VITALS — BP 150/78 | HR 63 | Ht 71.0 in | Wt 252.0 lb

## 2023-11-27 DIAGNOSIS — N1831 Chronic kidney disease, stage 3a: Secondary | ICD-10-CM

## 2023-11-27 DIAGNOSIS — I160 Hypertensive urgency: Secondary | ICD-10-CM

## 2023-11-27 DIAGNOSIS — I5042 Chronic combined systolic (congestive) and diastolic (congestive) heart failure: Secondary | ICD-10-CM | POA: Diagnosis not present

## 2023-11-27 DIAGNOSIS — I5021 Acute systolic (congestive) heart failure: Secondary | ICD-10-CM | POA: Diagnosis not present

## 2023-11-27 DIAGNOSIS — I10 Essential (primary) hypertension: Secondary | ICD-10-CM | POA: Diagnosis not present
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
## 2024-01-22 ENCOUNTER — Other Ambulatory Visit: Payer: Self-pay | Admitting: Cardiovascular Disease

## 2024-01-22 DIAGNOSIS — I5042 Chronic combined systolic (congestive) and diastolic (congestive) heart failure: Secondary | ICD-10-CM

## 2024-02-26 ENCOUNTER — Other Ambulatory Visit: Payer: Self-pay | Admitting: Cardiovascular Disease

## 2024-03-04 ENCOUNTER — Ambulatory Visit: Payer: No Typology Code available for payment source | Admitting: Cardiovascular Disease

## 2024-03-04 ENCOUNTER — Encounter: Payer: Self-pay | Admitting: Cardiovascular Disease

## 2024-03-04 VITALS — BP 128/77 | HR 59 | Ht 71.0 in | Wt 255.0 lb

## 2024-03-04 DIAGNOSIS — I10 Essential (primary) hypertension: Secondary | ICD-10-CM

## 2024-03-04 DIAGNOSIS — E669 Obesity, unspecified: Secondary | ICD-10-CM

## 2024-03-04 DIAGNOSIS — N1831 Chronic kidney disease, stage 3a: Secondary | ICD-10-CM

## 2024-03-04 DIAGNOSIS — I5042 Chronic combined systolic (congestive) and diastolic (congestive) heart failure: Secondary | ICD-10-CM
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
## 2024-03-20 ENCOUNTER — Other Ambulatory Visit: Payer: Self-pay | Admitting: Cardiovascular Disease

## 2024-03-20 DIAGNOSIS — I5021 Acute systolic (congestive) heart failure: Secondary | ICD-10-CM

## 2024-03-20 DIAGNOSIS — I10 Essential (primary) hypertension: Secondary | ICD-10-CM

## 2024-03-20 DIAGNOSIS — E669 Obesity, unspecified: Secondary | ICD-10-CM

## 2024-03-20 DIAGNOSIS — N179 Acute kidney failure, unspecified: Secondary | ICD-10-CM

## 2024-04-11 ENCOUNTER — Other Ambulatory Visit

## 2024-05-23 ENCOUNTER — Other Ambulatory Visit: Payer: Self-pay | Admitting: Cardiovascular Disease

## 2024-05-27 ENCOUNTER — Other Ambulatory Visit: Payer: Self-pay | Admitting: Cardiovascular Disease

## 2024-05-27 DIAGNOSIS — I5042 Chronic combined systolic (congestive) and diastolic (congestive) heart failure: Secondary | ICD-10-CM

## 2024-05-30 ENCOUNTER — Other Ambulatory Visit

## 2024-06-03 ENCOUNTER — Ambulatory Visit (INDEPENDENT_AMBULATORY_CARE_PROVIDER_SITE_OTHER)

## 2024-06-03 DIAGNOSIS — I340 Nonrheumatic mitral (valve) insufficiency: Secondary | ICD-10-CM

## 2024-06-03 DIAGNOSIS — N1831 Chronic kidney disease, stage 3a: Secondary | ICD-10-CM

## 2024-06-03 DIAGNOSIS — I10 Essential (primary) hypertension: Secondary | ICD-10-CM

## 2024-06-03 DIAGNOSIS — I5042 Chronic combined systolic (congestive) and diastolic (congestive) heart failure: Secondary | ICD-10-CM

## 2024-06-03 DIAGNOSIS — E669 Obesity, unspecified: Secondary | ICD-10-CM

## 2024-06-06 ENCOUNTER — Ambulatory Visit: Admitting: Cardiovascular Disease

## 2024-06-06 ENCOUNTER — Encounter: Payer: Self-pay | Admitting: Cardiovascular Disease

## 2024-06-06 VITALS — BP 122/80 | HR 57 | Ht 71.0 in | Wt 247.2 lb

## 2024-06-06 DIAGNOSIS — I5042 Chronic combined systolic (congestive) and diastolic (congestive) heart failure: Secondary | ICD-10-CM

## 2024-06-06 DIAGNOSIS — E669 Obesity, unspecified: Secondary | ICD-10-CM | POA: Diagnosis not present

## 2024-06-06 DIAGNOSIS — I10 Essential (primary) hypertension: Secondary | ICD-10-CM

## 2024-06-06 DIAGNOSIS — N179 Acute kidney failure, unspecified: Secondary | ICD-10-CM
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
## 2024-09-04 ENCOUNTER — Encounter: Payer: Self-pay | Admitting: Cardiovascular Disease

## 2024-09-04 ENCOUNTER — Ambulatory Visit: Admitting: Cardiovascular Disease

## 2024-09-04 VITALS — BP 119/77 | HR 60 | Ht 71.0 in | Wt 245.0 lb

## 2024-09-04 DIAGNOSIS — I10 Essential (primary) hypertension: Secondary | ICD-10-CM

## 2024-09-04 DIAGNOSIS — I5033 Acute on chronic diastolic (congestive) heart failure: Secondary | ICD-10-CM | POA: Diagnosis not present

## 2024-09-04 DIAGNOSIS — N1831 Chronic kidney disease, stage 3a: Secondary | ICD-10-CM | POA: Diagnosis not present

## 2024-09-04 DIAGNOSIS — I5021 Acute systolic (congestive) heart failure: Secondary | ICD-10-CM | POA: Diagnosis not present
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
## 2024-09-05 ENCOUNTER — Ambulatory Visit: Admitting: Cardiovascular Disease

## 2024-09-27 ENCOUNTER — Other Ambulatory Visit: Payer: Self-pay | Admitting: Cardiovascular Disease

## 2024-09-27 DIAGNOSIS — I5042 Chronic combined systolic (congestive) and diastolic (congestive) heart failure: Secondary | ICD-10-CM

## 2024-12-12 ENCOUNTER — Encounter: Payer: Self-pay | Admitting: Cardiovascular Disease

## 2024-12-12 ENCOUNTER — Ambulatory Visit: Admitting: Cardiovascular Disease

## 2024-12-12 VITALS — BP 138/80 | HR 60 | Ht 71.0 in | Wt 254.2 lb

## 2024-12-12 DIAGNOSIS — I5021 Acute systolic (congestive) heart failure: Secondary | ICD-10-CM

## 2024-12-12 DIAGNOSIS — N1831 Chronic kidney disease, stage 3a: Secondary | ICD-10-CM | POA: Diagnosis not present

## 2024-12-12 DIAGNOSIS — I160 Hypertensive urgency: Secondary | ICD-10-CM

## 2024-12-12 DIAGNOSIS — I5042 Chronic combined systolic (congestive) and diastolic (congestive) heart failure: Secondary | ICD-10-CM | POA: Diagnosis not present

## 2024-12-12 DIAGNOSIS — E669 Obesity, unspecified: Secondary | ICD-10-CM | POA: Diagnosis not present

## 2024-12-12 DIAGNOSIS — I10 Essential (primary) hypertension: Secondary | ICD-10-CM | POA: Diagnosis not present
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
## 2024-12-13 LAB — COMPREHENSIVE METABOLIC PANEL WITH GFR
ALT: 28 IU/L (ref 0–44)
AST: 24 IU/L (ref 0–40)
Albumin: 4 g/dL (ref 3.9–4.9)
Alkaline Phosphatase: 64 IU/L (ref 47–123)
BUN/Creatinine Ratio: 13 (ref 10–24)
BUN: 23 mg/dL (ref 8–27)
Bilirubin Total: 0.5 mg/dL (ref 0.0–1.2)
CO2: 22 mmol/L (ref 20–29)
Calcium: 9.2 mg/dL (ref 8.6–10.2)
Chloride: 105 mmol/L (ref 96–106)
Creatinine, Ser: 1.83 mg/dL — ABNORMAL HIGH (ref 0.76–1.27)
Globulin, Total: 2.6 g/dL (ref 1.5–4.5)
Glucose: 106 mg/dL — ABNORMAL HIGH (ref 70–99)
Potassium: 4.8 mmol/L (ref 3.5–5.2)
Sodium: 141 mmol/L (ref 134–144)
Total Protein: 6.6 g/dL (ref 6.0–8.5)
eGFR: 40 mL/min/1.73 — ABNORMAL LOW

## 2024-12-18 ENCOUNTER — Encounter: Payer: Self-pay | Admitting: Cardiovascular Disease

## 2024-12-18 ENCOUNTER — Ambulatory Visit: Payer: Self-pay

## 2024-12-18 ENCOUNTER — Ambulatory Visit: Admitting: Cardiovascular Disease

## 2024-12-18 VITALS — BP 142/58 | HR 64 | Ht 71.0 in | Wt 252.0 lb

## 2024-12-18 DIAGNOSIS — I5033 Acute on chronic diastolic (congestive) heart failure: Secondary | ICD-10-CM | POA: Diagnosis not present

## 2024-12-18 DIAGNOSIS — E669 Obesity, unspecified: Secondary | ICD-10-CM | POA: Diagnosis not present

## 2024-12-18 DIAGNOSIS — N1831 Chronic kidney disease, stage 3a: Secondary | ICD-10-CM

## 2024-12-18 DIAGNOSIS — I10 Essential (primary) hypertension: Secondary | ICD-10-CM | POA: Diagnosis not present

## 2024-12-18 DIAGNOSIS — I5042 Chronic combined systolic (congestive) and diastolic (congestive) heart failure: Secondary | ICD-10-CM

## 2024-12-18 MED ORDER — LOSARTAN POTASSIUM 25 MG PO TABS: 25.0000 mg | ORAL_TABLET | Freq: Every day | ORAL | 1 refills | Status: DC
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
## 2024-12-26 ENCOUNTER — Ambulatory Visit: Admitting: Cardiovascular Disease

## 2024-12-27 ENCOUNTER — Other Ambulatory Visit: Payer: Self-pay | Admitting: Cardiovascular Disease

## 2024-12-27 DIAGNOSIS — I10 Essential (primary) hypertension: Secondary | ICD-10-CM

## 2024-12-27 DIAGNOSIS — I5021 Acute systolic (congestive) heart failure: Secondary | ICD-10-CM

## 2024-12-27 DIAGNOSIS — E669 Obesity, unspecified: Secondary | ICD-10-CM

## 2024-12-27 DIAGNOSIS — N179 Acute kidney failure, unspecified: Secondary | ICD-10-CM

## 2025-01-06 ENCOUNTER — Other Ambulatory Visit: Payer: Self-pay | Admitting: Cardiovascular Disease

## 2025-01-06 DIAGNOSIS — I5042 Chronic combined systolic (congestive) and diastolic (congestive) heart failure: Secondary | ICD-10-CM

## 2025-01-08 ENCOUNTER — Other Ambulatory Visit: Payer: Self-pay

## 2025-01-08 ENCOUNTER — Telehealth: Payer: Self-pay

## 2025-01-08 DIAGNOSIS — I5042 Chronic combined systolic (congestive) and diastolic (congestive) heart failure: Secondary | ICD-10-CM

## 2025-01-08 MED ORDER — SACUBITRIL-VALSARTAN 97-103 MG PO TABS: 1.0000 | ORAL_TABLET | Freq: Two times a day (BID) | ORAL | 3 refills | Status: AC

## 2025-01-08 NOTE — Telephone Encounter (Signed)
 Pt called requesting refill on rx Entresto , I didn't see on his med list. He only has 2 pills left, please advise

## 2025-01-08 NOTE — Telephone Encounter (Signed)
 Rx sent.

## 2025-01-09 ENCOUNTER — Other Ambulatory Visit

## 2025-01-15 ENCOUNTER — Ambulatory Visit: Admitting: Cardiovascular Disease

## 2025-02-06 ENCOUNTER — Other Ambulatory Visit

## 2025-02-10 ENCOUNTER — Ambulatory Visit: Admitting: Cardiovascular Disease
